# Patient Record
Sex: Male | Born: 1960 | Race: Black or African American | Hispanic: No | Marital: Single | Smoking: Current every day smoker
Health system: Southern US, Community
[De-identification: ages and names within clinical notes are randomized; demographics above are authoritative.]

## PROBLEM LIST (undated history)

## (undated) DIAGNOSIS — Z720 Tobacco use: Secondary | ICD-10-CM

## (undated) DIAGNOSIS — L0390 Cellulitis, unspecified: Secondary | ICD-10-CM

## (undated) DIAGNOSIS — E7800 Pure hypercholesterolemia, unspecified: Secondary | ICD-10-CM

## (undated) DIAGNOSIS — I10 Essential (primary) hypertension: Secondary | ICD-10-CM

## (undated) DIAGNOSIS — K859 Acute pancreatitis without necrosis or infection, unspecified: Secondary | ICD-10-CM

---

## 2013-03-26 ENCOUNTER — Emergency Department (HOSPITAL_COMMUNITY)
Admission: EM | Admit: 2013-03-26 | Discharge: 2013-03-27 | Disposition: A | Discharge status: Home / Self Care | Payer: 59 | Attending: Emergency Medicine | Admitting: Emergency Medicine

## 2013-03-26 ENCOUNTER — Emergency Department (HOSPITAL_COMMUNITY): Payer: 59

## 2013-03-26 ENCOUNTER — Encounter (HOSPITAL_COMMUNITY): Payer: Self-pay | Admitting: *Deleted

## 2013-03-26 DIAGNOSIS — R142 Eructation: Secondary | ICD-10-CM | POA: Insufficient documentation

## 2013-03-26 DIAGNOSIS — Z79899 Other long term (current) drug therapy: Secondary | ICD-10-CM | POA: Insufficient documentation

## 2013-03-26 DIAGNOSIS — F17200 Nicotine dependence, unspecified, uncomplicated: Secondary | ICD-10-CM | POA: Insufficient documentation

## 2013-03-26 DIAGNOSIS — K852 Alcohol induced acute pancreatitis without necrosis or infection: Secondary | ICD-10-CM

## 2013-03-26 DIAGNOSIS — R141 Gas pain: Secondary | ICD-10-CM | POA: Insufficient documentation

## 2013-03-26 DIAGNOSIS — R110 Nausea: Secondary | ICD-10-CM | POA: Insufficient documentation

## 2013-03-26 DIAGNOSIS — K861 Other chronic pancreatitis: Secondary | ICD-10-CM | POA: Insufficient documentation

## 2013-03-26 DIAGNOSIS — R143 Flatulence: Secondary | ICD-10-CM | POA: Insufficient documentation

## 2013-03-26 DIAGNOSIS — I10 Essential (primary) hypertension: Secondary | ICD-10-CM | POA: Insufficient documentation

## 2013-03-26 HISTORY — DX: Essential (primary) hypertension: I10

## 2013-03-26 LAB — BASIC METABOLIC PANEL
CO2: 25 mEq/L (ref 19–32)
Calcium: 9.3 mg/dL (ref 8.4–10.5)
GFR calc non Af Amer: >90 mL/min (ref >90)
Glucose, Bld: 113 mg/dL — ABNORMAL HIGH (ref 70–99)
Potassium: 3.9 mEq/L (ref 3.5–5.1)
Sodium: 134 mEq/L — ABNORMAL LOW (ref 135–145)

## 2013-03-26 LAB — CBC WITH DIFFERENTIAL/PLATELET
Basophils Absolute: 0 10*3/uL (ref 0.0–0.1)
Lymphocytes Relative: 16 % (ref 12–46)
Lymphs Abs: 1.8 10*3/uL (ref 0.7–4.0)
Neutrophils Relative %: 71 % (ref 43–77)
Platelets: 319 10*3/uL (ref 150–400)
RBC: 4.54 MIL/uL (ref 4.22–5.81)
RDW: 14.2 % (ref 11.5–15.5)
WBC: 11.5 10*3/uL — ABNORMAL HIGH (ref 4.0–10.5)

## 2013-03-26 LAB — LIPASE, BLOOD: Lipase: 131 U/L — ABNORMAL HIGH (ref 11–59)

## 2013-03-26 LAB — HEPATIC FUNCTION PANEL
Albumin: 4 g/dL (ref 3.5–5.2)
Alkaline Phosphatase: 37 U/L — ABNORMAL LOW (ref 39–117)
Indirect Bilirubin: 0.7 mg/dL (ref 0.3–0.9)
Total Protein: 7.6 g/dL (ref 6.0–8.3)

## 2013-03-26 MED ORDER — ONDANSETRON HCL 4 MG/2ML IJ SOLN
4.0000 mg | Freq: Once | INTRAMUSCULAR | Status: AC
  Administered 2013-03-26: 4 mg via INTRAVENOUS
  Filled 2013-03-26: qty 2

## 2013-03-26 MED ORDER — SODIUM CHLORIDE 0.9 % IV SOLN
INTRAVENOUS | Status: DC
  Administered 2013-03-26: 22:00:00 via INTRAVENOUS

## 2013-03-26 MED ORDER — HYDROMORPHONE HCL PF 1 MG/ML IJ SOLN
1.0000 mg | Freq: Once | INTRAMUSCULAR | Status: AC
  Administered 2013-03-26: 1 mg via INTRAVENOUS
  Filled 2013-03-26: qty 1

## 2013-03-26 NOTE — ED Notes (Signed)
Pt ambulated to the restroom to attempt to get a urine sample.

## 2013-03-26 NOTE — ED Provider Notes (Signed)
History     CSN: [id number]  Arrival date & time 03/26/13  1701   First MD Initiated Contact with Patient 03/26/13 2111      Chief Complaint  Patient presents with  . Abdominal Pain    (Consider location/radiation/quality/duration/timing/severity/associated sxs/prior treatment) HPI Spencer Doyle is a 52 y.o. male with a history of hypertension presents emergency department complaining of abdominal pain.  Onset of symptoms began yesterday and are located primarily in the epigastric region.  Pain is rated at 10/10 in severity and has been gradually worsening since onset.  Patient reports similar pain in the past from a hernia of which was never repaired.  Associated symptoms include nausea and patient reports that his stomach is larger than it usually is.  This abdominal distention has been gradual over the last month. the patient denies any vomiting, diarrhea, fevers, night sweats or chills, chest pain, leg swelling, shortness of breath or dyspnea on exertion..  Pain radiates to back.  No other complaints this time.  Past Medical History  Diagnosis Date  . Hypertension     History reviewed. No pertinent past surgical history.  No family history on file.  History  Substance Use Topics  . Smoking status: Current Every Day Smoker  . Smokeless tobacco: Never Used  . Alcohol Use: Yes      Review of Systems Ten systems reviewed and are negative for acute change, except as noted in the HPI.    Allergies  Review of patient's allergies indicates no known allergies.  Home Medications   Current Outpatient Rx  Name  Route  Sig  Dispense  Refill  . vitamin C (ASCORBIC ACID) 500 MG tablet   Oral   Take 500 mg by mouth daily.           BP 176/105  Pulse 93  Temp(Src) 98.9 F (37.2 C) (Oral)  Resp 22  SpO2 98%  Physical Exam  Nursing note and vitals reviewed. Constitutional: He is oriented to person, place, and time. He appears well-developed and well-nourished. He appears  distressed.  Hypertensive  HENT:  Head: Normocephalic and atraumatic.  Eyes: Conjunctivae and EOM are normal.  Neck: Normal range of motion.  Cardiovascular:  Regular rate rhythm.  Intact distal pulses.  Pulmonary/Chest: Effort normal.  Abdominal:  Tenderness to palpation of the right upper quadrant, epigastric and left upper quadrant regions.  No peritoneal signs.  Abdominal distention present.  No fluid wave.  Musculoskeletal: Normal range of motion.  Neurological: He is alert and oriented to person, place, and time.  Good coordination.  Normal gait.  Intact distal sensation.  Skin: Skin is warm and dry. No rash noted. He is not diaphoretic.  Psychiatric: He has a normal mood and affect. His behavior is normal.    ED Course  Procedures (including critical care time)  Labs Reviewed  CBC WITH DIFFERENTIAL - Abnormal; Notable for the following:    WBC 11.5 (*)    Neutro Abs 8.1 (*)    Monocytes Relative 13 (*)    Monocytes Absolute 1.5 (*)    All other components within normal limits  BASIC METABOLIC PANEL - Abnormal; Notable for the following:    Sodium 134 (*)    Glucose, Bld 113 (*)    All other components within normal limits  URINALYSIS, ROUTINE W REFLEX MICROSCOPIC  HEPATIC FUNCTION PANEL  LIPASE, BLOOD   US Abdomen Complete  03/26/2013  *RADIOLOGY REPORT*  Clinical Data:  Abdominal pain.  Elevated lipase and LFTs.  COMPLETE ABDOMINAL ULTRASOUND  Comparison:  None.  Findings:  Gallbladder:  No stones or wall thickening.  Negative sonographic Murphy's.  Common bile duct:   Normal caliber, 5 mm.  Liver:  2 hyperechoic areas within the liver.  The largest is in the inferior right hepatic lobe and measures up to 11 mm.  These are most compatible with hemangiomas, but follow-up is recommended. No biliary ductal dilatation.  IVC:  Appears normal.  Pancreas:  No focal abnormality seen.  Spleen:  Within normal limits in size and echotexture.  Right Kidney:   Normal in size and  parenchymal echogenicity.  No evidence of mass or hydronephrosis.  Left Kidney:  Normal in size and parenchymal echogenicity.  No evidence of mass or hydronephrosis.  Abdominal aorta:  No aneurysm identified.  IMPRESSION: 2 small hyperechoic areas within the right hepatic lobe, most compatible with hemangiomas.  Follow-up ultrasound in 6-12 months could be performed to confirm stability.  No acute findings.   Original Report Authenticated By: Charlett Nose, M.D.    Ct Abdomen Pelvis W Contrast  03/27/2013  *RADIOLOGY REPORT*  Clinical Data: 52 year old male with abdominal pain increasing times 2 days.  Abnormal lipase.  CT ABDOMEN AND PELVIS WITH CONTRAST  Technique:  Multidetector CT imaging of the abdomen and pelvis was performed following the standard protocol during bolus administration of intravenous contrast.  Contrast: OMNIPAQUE IOHEXOL 300 MG/ML  SOLN  Comparison: Abdominal ultrasound 03/26/2013.  Findings: Dependent pulmonary opacity in both lower lobes most resembles atelectasis.  No pleural or pericardial effusion.  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  Chronic disc and endplate degeneration in the lower lumbar spine. No acute osseous abnormality identified.  No pelvic free fluid.  Mildly distended but otherwise unremarkable bladder.  Inflammation surrounding the tail of the pancreas, splenic flexure, and lower pole of the spleen (series 2 image 22). Enhancement of the pancreatic tail is preserved.  The body, head and uncinate process and have more normal appearance.  The splenic vein remains patent along with the main portal vein and portal venous system. Trace layering fluid.  No organized fluid collection.  No free air.  Mild gaseous distention then of the transverse colon.  Right colon including the appendix is normal.  No dilated small bowel.  Oral contrast has not yet reached the distal small bowel.  Mild inflammation involving the proximal stomach.  Distal  stomach and duodenum have more normal appearance.  Mild decreased density throughout the liver.  Small hypoechoic areas are occasionally noted (in the posterior right hepatic lobe series 2 image 27) and fade on delayed images is compatible with benign hemangioma as suspected on the earlier ultrasound. A small area of early hyperenhancement at the dome on image 12 also appears benign.  Gallbladder, adrenal glands, and kidneys are within normal limits. Major arterial structures in the abdomen pelvis are patent although there is moderate intermittent atherosclerosis noted.  IMPRESSION: 1.  Acute pancreatitis with inflammation involving the tail, and secondarily involving the spleen and splenic flexure of colon. Small volume free fluid with no drainable or organized fluid collection.  No other complicating features identified. 2.  Hepatic steatosis with several small benign hepatic hemangioma is suspected. 3.  Atherosclerosis.   Original Report Authenticated By: Erskine Speed, M.D.      No diagnosis found.  52 year old male to emergency Department complaining of upper abdominal pain that has gradually worsened since yesterday and now severe.  From triage patient has leukocytosis  with shift to the left, is hypertensive, but afebrile with normal heart rate and respirations.  Patient appears distressed.  Pain medications ordered as well as abdominal ultrasound, liver function studies and lipase.  Suspect possible biliary etiology.  MDM  Alcoholic pancreatitis  Pt states that he has been told he has hx of alcoholic pancreatitis in past & admits to drinking 12 pack daily. Pt does not appear in distress & is w normal VS other then HTN (hx of, non compliant on medication) discuss treating physician and we'll give resource guide at discharge.  Patient is without vomiting and pain improved while in the emergency department.  Patient appears stable for discharge.  Advised PCP followup today to discuss both pancreatitis  and hypertension.  Patient will be placed on clear liquid diet and given narcotic pain medication as well as antibiotics.       Jaci Carrel, [place] 03/27/13 [phone]

## 2013-03-26 NOTE — ED Notes (Signed)
Pt states started having abdominal pain yesterday, pain became severe around midnight, pt denies n/v/d.

## 2013-03-26 NOTE — ED Notes (Signed)
Pt unable to provide sample at this time. Urine cup remains at bedside.

## 2013-03-27 ENCOUNTER — Emergency Department (HOSPITAL_COMMUNITY): Payer: 59

## 2013-03-27 ENCOUNTER — Encounter (HOSPITAL_COMMUNITY): Payer: Self-pay

## 2013-03-27 LAB — URINALYSIS, ROUTINE W REFLEX MICROSCOPIC
Glucose, UA: NEGATIVE mg/dL
Hgb urine dipstick: NEGATIVE
Leukocytes, UA: NEGATIVE
Protein, ur: NEGATIVE mg/dL
Specific Gravity, Urine: 1.019 (ref 1.005–1.030)
Urobilinogen, UA: 0.2 mg/dL (ref 0.0–1.0)

## 2013-03-27 MED ORDER — SODIUM CHLORIDE 0.9 % IV BOLUS (SEPSIS)
1000.0000 mL | Freq: Once | INTRAVENOUS | Status: AC
  Administered 2013-03-27: 1000 mL via INTRAVENOUS

## 2013-03-27 MED ORDER — HYDROCODONE-ACETAMINOPHEN 5-325 MG PO TABS: 1.0000 | ORAL_TABLET | Freq: Four times a day (QID) | ORAL | Status: DC | PRN

## 2013-03-27 MED ORDER — ONDANSETRON 4 MG PO TBDP: 4.0000 mg | ORAL_TABLET | Freq: Four times a day (QID) | ORAL | Status: DC | PRN

## 2013-03-27 MED ORDER — IOHEXOL 300 MG/ML  SOLN
50.0000 mL | Freq: Once | INTRAMUSCULAR | Status: AC | PRN
  Administered 2013-03-27: 50 mL via ORAL

## 2013-03-27 MED ORDER — IOHEXOL 300 MG/ML  SOLN
100.0000 mL | Freq: Once | INTRAMUSCULAR | Status: AC | PRN
  Administered 2013-03-27: 100 mL via INTRAVENOUS

## 2013-03-27 NOTE — ED Provider Notes (Signed)
Medical screening examination/treatment/procedure(s) were conducted as a shared visit with non-physician practitioner(s) and myself.  I personally evaluated the patient during the encounter  Concerning for pancreatitis. Will image  Lyanne Co, MD 03/27/13 1425

## 2013-03-27 NOTE — ED Provider Notes (Signed)
And abdominal pain onset epigastric onset 2 days ago. Patient is to drinking approximately one12 pack every weekend. He's had no vomiting he admits to nausea no fever no other complaint. Patient reports he was told he had alcoholic pancreatitis several years ago. On exam patient is alert and nontoxic abdomen soft normoactive bowel sounds tender epigastrium no guarding plan clear liquid diet analgesics avoid alcohol followup with PMD later today  Doug Sou, MD 03/27/13 0230

## 2014-04-28 ENCOUNTER — Emergency Department (HOSPITAL_COMMUNITY)
Admission: EM | Admit: 2014-04-28 | Discharge: 2014-04-29 | Disposition: A | Discharge status: Home / Self Care | Payer: 59 | Attending: Emergency Medicine | Admitting: Emergency Medicine

## 2014-04-28 ENCOUNTER — Encounter (HOSPITAL_COMMUNITY): Payer: Self-pay | Admitting: Emergency Medicine

## 2014-04-28 ENCOUNTER — Emergency Department (HOSPITAL_COMMUNITY): Payer: 59

## 2014-04-28 DIAGNOSIS — L03818 Cellulitis of other sites: Principal | ICD-10-CM

## 2014-04-28 DIAGNOSIS — L02818 Cutaneous abscess of other sites: Secondary | ICD-10-CM | POA: Insufficient documentation

## 2014-04-28 DIAGNOSIS — F17200 Nicotine dependence, unspecified, uncomplicated: Secondary | ICD-10-CM | POA: Insufficient documentation

## 2014-04-28 DIAGNOSIS — L03213 Periorbital cellulitis: Secondary | ICD-10-CM

## 2014-04-28 DIAGNOSIS — I10 Essential (primary) hypertension: Secondary | ICD-10-CM | POA: Insufficient documentation

## 2014-04-28 DIAGNOSIS — Z79899 Other long term (current) drug therapy: Secondary | ICD-10-CM | POA: Insufficient documentation

## 2014-04-28 DIAGNOSIS — H5710 Ocular pain, unspecified eye: Secondary | ICD-10-CM | POA: Insufficient documentation

## 2014-04-28 LAB — BASIC METABOLIC PANEL
BUN: 13 mg/dL (ref 6–23)
CALCIUM: 9.6 mg/dL (ref 8.4–10.5)
CO2: 22 mEq/L (ref 19–32)
Chloride: 103 mEq/L (ref 96–112)
Creatinine, Ser: 0.81 mg/dL (ref 0.50–1.35)
Glucose, Bld: 86 mg/dL (ref 70–99)
POTASSIUM: 4.2 meq/L (ref 3.7–5.3)
SODIUM: 140 meq/L (ref 137–147)

## 2014-04-28 LAB — I-STAT CHEM 8, ED
BUN: 12 mg/dL (ref 6–23)
CALCIUM ION: 1.23 mmol/L (ref 1.12–1.23)
CREATININE: 0.9 mg/dL (ref 0.50–1.35)
Chloride: 104 mEq/L (ref 96–112)
GLUCOSE: 87 mg/dL (ref 70–99)
HCT: 39 % (ref 39.0–52.0)
HEMOGLOBIN: 13.3 g/dL (ref 13.0–17.0)
Potassium: 4 mEq/L (ref 3.7–5.3)
Sodium: 142 mEq/L (ref 137–147)
TCO2: 23 mmol/L (ref 0–100)

## 2014-04-28 LAB — CBC WITH DIFFERENTIAL/PLATELET
BASOS PCT: 0 % (ref 0–1)
Basophils Absolute: 0 10*3/uL (ref 0.0–0.1)
EOS ABS: 0.1 10*3/uL (ref 0.0–0.7)
Eosinophils Relative: 1 % (ref 0–5)
HCT: 35.3 % — ABNORMAL LOW (ref 39.0–52.0)
Hemoglobin: 12.1 g/dL — ABNORMAL LOW (ref 13.0–17.0)
Lymphocytes Relative: 42 % (ref 12–46)
Lymphs Abs: 3.4 10*3/uL (ref 0.7–4.0)
MCH: 30.8 pg (ref 26.0–34.0)
MCHC: 34.3 g/dL (ref 30.0–36.0)
MCV: 89.8 fL (ref 78.0–100.0)
Monocytes Absolute: 0.8 10*3/uL (ref 0.1–1.0)
Monocytes Relative: 10 % (ref 3–12)
NEUTROS PCT: 47 % (ref 43–77)
Neutro Abs: 3.9 10*3/uL (ref 1.7–7.7)
PLATELETS: 387 10*3/uL (ref 150–400)
RBC: 3.93 MIL/uL — ABNORMAL LOW (ref 4.22–5.81)
RDW: 14.4 % (ref 11.5–15.5)
WBC: 8.1 10*3/uL (ref 4.0–10.5)

## 2014-04-28 MED ORDER — IOHEXOL 300 MG/ML  SOLN
80.0000 mL | Freq: Once | INTRAMUSCULAR | Status: AC | PRN
  Administered 2014-04-28: 80 mL via INTRAVENOUS

## 2014-04-28 MED ORDER — TETRACAINE HCL 0.5 % OP SOLN
1.0000 [drp] | Freq: Once | OPHTHALMIC | Status: AC
  Administered 2014-04-28: 1 [drp] via OPHTHALMIC
  Filled 2014-04-28: qty 2

## 2014-04-28 MED ORDER — OXYCODONE-ACETAMINOPHEN 5-325 MG PO TABS
2.0000 | ORAL_TABLET | Freq: Once | ORAL | Status: AC
  Administered 2014-04-28: 2 via ORAL
  Filled 2014-04-28: qty 2

## 2014-04-28 MED ORDER — ONDANSETRON 4 MG PO TBDP
4.0000 mg | ORAL_TABLET | Freq: Once | ORAL | Status: AC
  Administered 2014-04-28: 4 mg via ORAL
  Filled 2014-04-28: qty 1

## 2014-04-28 MED ORDER — OXYCODONE-ACETAMINOPHEN 5-325 MG PO TABS: 1.0000 | ORAL_TABLET | ORAL | Status: DC | PRN

## 2014-04-28 MED ORDER — SULFAMETHOXAZOLE-TMP DS 800-160 MG PO TABS: 1.0000 | ORAL_TABLET | Freq: Two times a day (BID) | ORAL | Status: DC

## 2014-04-28 MED ORDER — AMOXICILLIN 875 MG PO TABS: 875.0000 mg | ORAL_TABLET | Freq: Two times a day (BID) | ORAL | Status: DC

## 2014-04-28 MED ORDER — FLUORESCEIN SODIUM 1 MG OP STRP
1.0000 | ORAL_STRIP | Freq: Once | OPHTHALMIC | Status: AC
  Administered 2014-04-28: 1 via OPHTHALMIC
  Filled 2014-04-28: qty 1

## 2014-04-28 NOTE — ED Provider Notes (Signed)
CSN: [id number]     Arrival date & time 04/28/14  1909 History  This chart was scribed for non-physician Arthor Captain, PA-C practitioner working with Enid Skeens, MD by Elveria Rising, ED Scribe. This patient was seen in room TR04C/TR04C and the patient's care was started at 8:57 PM.   Chief Complaint  Patient presents with  . Facial Swelling  . Eye Pain     The history is provided by the patient. No language interpreter was used.   HPI Comments: Spencer Doyle is a 53 y.o. male who presents to the Emergency Department complaining of right eye pain and swelling ongoing for two days. Patient reports that the pain radiates from the eyeball to the socket and is exacerbated with movement of the eye. Patient is unsure of presence of foreign body. Patient reports blurred vision, watery drainage and sensitivity to sunlight. Patient denies history of Diabetes.    Past Medical History  Diagnosis Date  . Hypertension    History reviewed. No pertinent past surgical history. No family history on file. History  Substance Use Topics  . Smoking status: Current Every Day Smoker  . Smokeless tobacco: Never Used  . Alcohol Use: Yes    Review of Systems  Constitutional: Negative for fever.  HENT: Positive for facial swelling.   Eyes: Positive for photophobia, pain, discharge and visual disturbance. Negative for itching.  Psychiatric/Behavioral: Negative for confusion.      Allergies  Review of patient's allergies indicates no known allergies.  Home Medications   Prior to Admission medications   Medication Sig Start Date End Date Taking? Authorizing Provider  hydrochlorothiazide (HYDRODIURIL) 25 MG tablet Take 25 mg by mouth 2 (two) times daily.   Yes Historical Provider, MD  vitamin C (ASCORBIC ACID) 500 MG tablet Take 500 mg by mouth daily.   Yes Historical Provider, MD  amoxicillin (AMOXIL) 875 MG tablet Take 1 tablet (875 mg total) by mouth 2 (two) times daily. 04/28/14   Arthor Captain, PA-C  oxyCODONE-acetaminophen (PERCOCET) 5-325 MG per tablet Take 1-2 tablets by mouth every 4 (four) hours as needed. 04/28/14   Arthor Captain, PA-C  sulfamethoxazole-trimethoprim (BACTRIM DS) 800-160 MG per tablet Take 1 tablet by mouth 2 (two) times daily. 04/28/14   Arthor Captain, PA-C   Triage Vitals: BP 167/109  Pulse 88  Temp(Src) 98.6 F (37 C) (Oral)  Resp 18  Wt 192 lb 3 oz (87.176 kg)  SpO2 94% Physical Exam  Nursing note and vitals reviewed. Constitutional: He is oriented to person, place, and time. He appears well-developed and well-nourished. No distress.  HENT:  Head: Normocephalic and atraumatic.  Eyes:  Slit lamp exam:      The right eye shows no fluorescein uptake.  Left eye: [employer]. Swelling. Lid is tender to palpation. Eye pressure OS: 22  OD: 16  Neck: Neck supple. No tracheal deviation present.  Cardiovascular: Normal rate.   Pulmonary/Chest: Effort normal. No respiratory distress.  Musculoskeletal: Normal range of motion.  Neurological: He is alert and oriented to person, place, and time.  Skin: Skin is warm and dry.  Psychiatric: He has a normal mood and affect. His behavior is normal.    ED Course  Procedures (including critical care time)   DIAGNOSTIC STUDIES: Oxygen Saturation is 94% on room air, adequate by my interpretation.    COORDINATION OF CARE: 9:10 PM- Discussed treatment plan with patient at bedside and patient agreed to plan.     Labs Review Labs Reviewed  CBC WITH DIFFERENTIAL - Abnormal; Notable for the following:    RBC 3.93 (*)    Hemoglobin 12.1 (*)    HCT 35.3 (*)    All other components within normal limits  BASIC METABOLIC PANEL  I-STAT CHEM 8, ED    Imaging Review Ct Orbits Wo/w Cm  04/28/2014   CLINICAL DATA:  Right eye pain and swelling.  Four prior episodes.  EXAM: CT ORBITS WITHOUT AND WITH CONTRAST  TECHNIQUE: Multidetector CT imaging of the orbits was performed using the standard protocol with  and without intravenous contrast.  CONTRAST:  80mL OMNIPAQUE IOHEXOL 300 MG/ML  SOLN  COMPARISON:  None.  FINDINGS: Minimal right periorbital soft tissue swelling, without subcutaneous gas or radiopaque foreign bodies. No subcutaneous free fluid or fluid collections.  Ocular globes are unremarkable, lenses are located. Preservation orbital fat. Extraocular muscles are located with normal morphology and symmetric appearance. Normal appearance of the optic nerve sheath complexes. Symmetric appearance of the cavernous sinuses, trace calcific atherosclerosis of the carotid siphons. Superior ophthalmic veins are not enlarged.  No destructive bony lesions. No acute facial fracture. Right anterior ethmoid sub cm osteoma, visualized paranasal sinuses are well aerated. Included mastoid air cells are well aerated. Left frontal air cell.  IMPRESSION: Minimal right periorbital soft tissue swelling could reflect cellulitis, without fluid collection or postseptal involvement.   Electronically Signed   By: Awilda Metroourtnay  Bloomer   On: 04/28/2014 23:17     EKG Interpretation None      MDM   Final diagnoses:  Preseptal cellulitis of right eye   I personally reviewed the imaging tests through PACS system. I have reviewed and interpreted Lab values. I reviewed available ER/hospitalization records through the EMR    The patient's CT scan negative for any cellulitis in the orbits. The patient does appear to have preseptal cellulitis. Discharged with antibiotics, followup with ophthalmology. Return precautions discussed.Patient / Family / Caregiver informed of clinical course, understand medical decision-making process, and agree with plan.    I personally performed the services described in this documentation, which was scribed in my presence. The recorded information has been reviewed and is accurate.    Arthor CaptainAbigail Sherrell Farish, PA-C 05/03/14 1818

## 2014-04-28 NOTE — Discharge Instructions (Signed)
Periorbital Cellulitis Periorbital cellulitis is a common infection that can affect the eyelid and the soft tissues that surround the eyeball. The infection may also affect the structures that produce and drain tears. It does not affect the eyeball itself. Natural tissue barriers usually prevent the spread of this infection to the eyeball and other deeper areas of the eye socket.  CAUSES  Bacterial infection.  Long-term (chronic) sinus infections.  An object (foreign body) stuck behind the eye.  An injury that goes through the eyelid tissues.  An injury that causes an infection, such as an insect sting.  Fracture of the bone around the eye.  Infections which have spread from the eyelid or other structures around the eye.  Bite wounds.  Inflammation or infection of the lining membranes of the brain (meningitis).  An infection in the blood (septicemia).  Dental infection (abscess).  Viral infection (this is rare). SYMPTOMS Symptoms usually come on suddenly.  Pain in the eye.  Red, hot, and swollen eyelids and possibly cheeks. The swelling is sometimes bad enough that the eyelids cannot open. Some infections make the eyelids look purple.  Fever and feeling generally ill.  Pain when touching the area around the eye. DIAGNOSIS  Periorbital cellulitis can be diagnosed from an eye exam. In severe cases, your caregiver might suggest:  Blood tests.  Imaging tests (such as a CT scan) to examine the sinuses and the area around and behind the eyeball. TREATMENT If your caregiver feels that you do not have any signs of serious infection, treatment may include:  Antibiotics.  Nasal decongestants to reduce swelling.  Referral to a dentist if it is suspected that the infection was caused by a prior tooth infection.  Examination every day to make sure the problem is improving. HOME CARE INSTRUCTIONS  Take your antibiotics as directed. Finish them even if you start to feel  better.  Some pain is normal with this condition. Take pain medicine as directed by your caregiver. Only take pain medicines approved by your caregiver.  It is important to drink fluids. Drink enough water and fluids to keep your urine clear or pale yellow.  Do not smoke.  Rest and get plenty of sleep.  Mild or moderate fevers generally have no long-term effects and often do not require treatment.  If your caregiver has given you a follow-up appointment, it is very important to keep that appointment. Your caregiver will need to make sure that the infection is getting better. It is important to check that a more serious infection is not developing. SEEK IMMEDIATE MEDICAL CARE IF:  Your eyelids become more painful, red, warm, or swollen.  You develop double vision or your vision becomes blurred or worsens in any way.  You have trouble moving your eyes.  The eye looks like it is popping out (proptosis).  You develop a severe headache, severe neck pain, or neck stiffness.  You develop repeated vomiting.  You have a fever or persistent symptoms for more than 72 hours.  You have a fever and your symptoms suddenly get worse. MAKE SURE YOU:  Understand these instructions.  Will watch your condition.  Will get help right away if you are not doing well or get worse. Document Released: 12/12/2010 Document Revised: 02/01/2012 Document Reviewed: 12/12/2010 [facility] Patient Information 2014 [place], [place]. Acetaminophen; Oxycodone tablets What is this medicine? ACETAMINOPHEN; OXYCODONE (a set a MEE noe fen; ox i KOE done) is a pain reliever. It is used to treat mild to moderate  pain. This medicine may be used for other purposes; ask your health care provider or pharmacist if you have questions. COMMON BRAND NAME(S): Endocet, Magnacet, Narvox, Percocet, Perloxx, Primalev, Primlev, Roxicet, Xolox What should I tell my health care provider before I take this medicine? They need to know  if you have any of these conditions: -brain tumor -Crohn's disease, inflammatory bowel disease, or ulcerative colitis -drug abuse or addiction -head injury -heart or circulation problems -if you often drink alcohol -kidney disease or problems going to the bathroom -liver disease -lung disease, asthma, or breathing problems -an unusual or allergic reaction to acetaminophen, oxycodone, other opioid analgesics, other medicines, foods, dyes, or preservatives -pregnant or trying to get pregnant -breast-feeding How should I use this medicine? Take this medicine by mouth with a full glass of water. Follow the directions on the prescription label. Take your medicine at regular intervals. Do not take your medicine more often than directed. Talk to your pediatrician regarding the use of this medicine in children. Special care may be needed. Patients over 53 years old may have a stronger reaction and need a smaller dose. Overdosage: If you think you have taken too much of this medicine contact a poison control center or emergency room at once. NOTE: This medicine is only for you. Do not share this medicine with others. What if I miss a dose? If you miss a dose, take it as soon as you can. If it is almost time for your next dose, take only that dose. Do not take double or extra doses. What may interact with this medicine? -alcohol -antihistamines -barbiturates like amobarbital, butalbital, butabarbital, methohexital, pentobarbital, phenobarbital, thiopental, and secobarbital -benztropine -drugs for bladder problems like solifenacin, trospium, oxybutynin, tolterodine, hyoscyamine, and methscopolamine -drugs for breathing problems like ipratropium and tiotropium -drugs for certain stomach or intestine problems like propantheline, homatropine methylbromide, glycopyrrolate, atropine, belladonna, and dicyclomine -general anesthetics like etomidate, ketamine, nitrous oxide, propofol, desflurane,  enflurane, halothane, isoflurane, and sevoflurane -medicines for depression, anxiety, or psychotic disturbances -medicines for sleep -muscle relaxants -naltrexone -narcotic medicines (opiates) for pain -phenothiazines like perphenazine, thioridazine, chlorpromazine, mesoridazine, fluphenazine, prochlorperazine, promazine, and trifluoperazine -scopolamine -tramadol -trihexyphenidyl This list may not describe all possible interactions. Give your health care provider a list of all the medicines, herbs, non-prescription drugs, or dietary supplements you use. Also tell them if you smoke, drink alcohol, or use illegal drugs. Some items may interact with your medicine. What should I watch for while using this medicine? Tell your doctor or health care professional if your pain does not go away, if it gets worse, or if you have new or a different type of pain. You may develop tolerance to the medicine. Tolerance means that you will need a higher dose of the medication for pain relief. Tolerance is normal and is expected if you take this medicine for a long time. Do not suddenly stop taking your medicine because you may develop a severe reaction. Your body becomes used to the medicine. This does NOT mean you are addicted. Addiction is a behavior related to getting and using a drug for a non-medical reason. If you have pain, you have a medical reason to take pain medicine. Your doctor will tell you how much medicine to take. If your doctor wants you to stop the medicine, the dose will be slowly lowered over time to avoid any side effects. You may get drowsy or dizzy. Do not drive, use machinery, or do anything that needs mental alertness until you know  how this medicine affects you. Do not stand or sit up quickly, especially if you are an older patient. This reduces the risk of dizzy or fainting spells. Alcohol may interfere with the effect of this medicine. Avoid alcoholic drinks. There are different types of  narcotic medicines (opiates) for pain. If you take more than one type at the same time, you may have more side effects. Give your health care provider a list of all medicines you use. Your doctor will tell you how much medicine to take. Do not take more medicine than directed. Call emergency for help if you have problems breathing. The medicine will cause constipation. Try to have a bowel movement at least every 2 to 3 days. If you do not have a bowel movement for 3 days, call your doctor or health care professional. Do not take Tylenol (acetaminophen) or medicines that have acetaminophen with this medicine. Too much acetaminophen can be very dangerous. Many nonprescription medicines contain acetaminophen. Always read the labels carefully to avoid taking more acetaminophen. What side effects may I notice from receiving this medicine? Side effects that you should report to your doctor or health care professional as soon as possible: -allergic reactions like skin rash, itching or hives, swelling of the face, lips, or tongue -breathing difficulties, wheezing -confusion -light headedness or fainting spells -severe stomach pain -unusually weak or tired -yellowing of the skin or the whites of the eyes  Side effects that usually do not require medical attention (report to your doctor or health care professional if they continue or are bothersome): -dizziness -drowsiness -nausea -vomiting This list may not describe all possible side effects. Call your doctor for medical advice about side effects. You may report side effects to FDA at 1-800-FDA-1088. Where should I keep my medicine? Keep out of the reach of children. This medicine can be abused. Keep your medicine in a safe place to protect it from theft. Do not share this medicine with anyone. Selling or giving away this medicine is dangerous and against the law. Store at room temperature between 20 and 25 degrees C (68 and 77 degrees F). Keep container  tightly closed. Protect from light. This medicine may cause accidental overdose and death if it is taken by other adults, children, or pets. Flush any unused medicine down the toilet to reduce the chance of harm. Do not use the medicine after the expiration date. NOTE: This sheet is a summary. It may not cover all possible information. If you have questions about this medicine, talk to your doctor, pharmacist, or health care provider.  2014, [employer]. (2013-07-03 13:17:35)

## 2014-04-28 NOTE — ED Notes (Addendum)
C/o R eye swelling, also pain, onset Wednesday morning. "feels like L eye might be starting", no known injury, h/o similar 4x in the past. Has tried dry eye gtts. Td >10 yrs.

## 2014-05-04 NOTE — ED Provider Notes (Signed)
Medical screening examination/treatment/procedure(s) were performed by non-physician practitioner and as supervising physician I was immediately available for consultation/collaboration.   EKG Interpretation None       Toy BakerAnthony T Cayton Cuevas, MD 05/04/14 [phone]

## 2014-10-24 ENCOUNTER — Emergency Department (HOSPITAL_COMMUNITY)
Admission: EM | Admit: 2014-10-24 | Discharge: 2014-10-24 | Disposition: A | Discharge status: Home / Self Care | Payer: 59 | Attending: Emergency Medicine | Admitting: Emergency Medicine

## 2014-10-24 ENCOUNTER — Encounter (HOSPITAL_COMMUNITY): Payer: Self-pay | Admitting: Emergency Medicine

## 2014-10-24 DIAGNOSIS — Z720 Tobacco use: Secondary | ICD-10-CM | POA: Diagnosis not present

## 2014-10-24 DIAGNOSIS — H9202 Otalgia, left ear: Secondary | ICD-10-CM | POA: Diagnosis present

## 2014-10-24 DIAGNOSIS — H6012 Cellulitis of left external ear: Secondary | ICD-10-CM | POA: Insufficient documentation

## 2014-10-24 DIAGNOSIS — Z630 Problems in relationship with spouse or partner: Secondary | ICD-10-CM | POA: Insufficient documentation

## 2014-10-24 DIAGNOSIS — I10 Essential (primary) hypertension: Secondary | ICD-10-CM | POA: Diagnosis not present

## 2014-10-24 DIAGNOSIS — Z792 Long term (current) use of antibiotics: Secondary | ICD-10-CM | POA: Insufficient documentation

## 2014-10-24 DIAGNOSIS — Z8639 Personal history of other endocrine, nutritional and metabolic disease: Secondary | ICD-10-CM | POA: Insufficient documentation

## 2014-10-24 HISTORY — DX: Pure hypercholesterolemia, unspecified: E78.00

## 2014-10-24 MED ORDER — SULFAMETHOXAZOLE-TRIMETHOPRIM 800-160 MG PO TABS: 1.0000 | ORAL_TABLET | Freq: Two times a day (BID) | ORAL | Status: DC

## 2014-10-24 MED ORDER — TRAMADOL HCL 50 MG PO TABS: 50.0000 mg | ORAL_TABLET | Freq: Four times a day (QID) | ORAL | Status: DC | PRN

## 2014-10-24 NOTE — Discharge Instructions (Signed)
Cellulitis °Cellulitis is an infection of the skin and the tissue beneath it. The infected area is usually red and tender. Cellulitis occurs most often in the arms and lower legs.  °CAUSES  °Cellulitis is caused by bacteria that enter the skin through cracks or cuts in the skin. The most common types of bacteria that cause cellulitis are staphylococci and streptococci. °SIGNS AND SYMPTOMS  °· Redness and warmth. °· Swelling. °· Tenderness or pain. °· Fever. °DIAGNOSIS  °Your health care provider can usually determine what is wrong based on a physical exam. Blood tests may also be done. °TREATMENT  °Treatment usually involves taking an antibiotic medicine. °HOME CARE INSTRUCTIONS  °· Take your antibiotic medicine as directed by your health care provider. Finish the antibiotic even if you start to feel better. °· Keep the infected arm or leg elevated to reduce swelling. °· Apply a warm cloth to the affected area up to 4 times per day to relieve pain. °· Take medicines only as directed by your health care provider. °· Keep all follow-up visits as directed by your health care provider. °SEEK MEDICAL CARE IF:  °· You notice red streaks coming from the infected area. °· Your red area gets larger or turns dark in color. °· Your bone or joint underneath the infected area becomes painful after the skin has healed. °· Your infection returns in the same area or another area. °· You notice a swollen bump in the infected area. °· You develop new symptoms. °· You have a fever. °SEEK IMMEDIATE MEDICAL CARE IF:  °· You feel very sleepy. °· You develop vomiting or diarrhea. °· You have a general ill feeling (malaise) with muscle aches and pains. °MAKE SURE YOU:  °· Understand these instructions. °· Will watch your condition. °· Will get help right away if you are not doing well or get worse. °Document Released: 08/19/2005 Document Revised: 03/26/2014 Document Reviewed: 01/25/2012 °ExitCare® Patient Information ©2015 [employer].  This information is not intended to replace advice given to you by your health care provider. Make sure you discuss any questions you have with your health care provider. ° ° °Emergency Department Resource Guide °1) Find a Doctor and Pay Out of Pocket °Although you won't have to find out who is covered by your insurance plan, it is a good idea to ask around and get recommendations. You will then need to call the office and see if the doctor you have chosen will accept you as a new patient and what types of options they offer for patients who are self-pay. Some doctors offer discounts or will set up payment plans for their patients who do not have insurance, but you will need to ask so you aren't surprised when you get to your appointment. ° °2) Contact Your Local Health Department °Not all health departments have doctors that can see patients for sick visits, but many do, so it is worth a call to see if yours does. If you don't know where your local health department is, you can check in your phone book. The CDC also has a tool to help you locate your state's health department, and many state websites also have listings of all of their local health departments. ° °3) Find a Walk-in Clinic °If your illness is not likely to be very severe or complicated, you may want to try a walk in clinic. These are popping up all over the country in pharmacies, drugstores, and shopping centers. They're usually staffed by nurse practitioners   or physician assistants that have been trained to treat common illnesses and complaints. They're usually fairly quick and inexpensive. However, if you have serious medical issues or chronic medical problems, these are probably not your best option. ° °No Primary Care Doctor: °- Call [facility] at  832-8000 - they can help you locate a primary care doctor that  accepts your insurance, provides certain services, etc. °- Physician Referral Service- 1-800-533-3463 ° °Chronic Pain  Problems: °Organization         Address  Phone   Notes  °[facility]  (336) 297-2271 Patients need to be referred by their primary care doctor.  ° °Medication Assistance: °Organization         Address  Phone   Notes  °[place] [facility] [address] °[place], [place] [address] (336) 641-8030 --Must be a resident of [place] °-- Must have NO insurance coverage whatsoever (no Medicaid/ Medicare, etc.) °-- The pt. MUST have a primary care doctor that directs their care regularly and follows them in the community °  °[facility]  (866) 331-1348   °[employer]  (888) 892-1162   ° °Agencies that provide inexpensive medical care: °Organization         Address  Phone   Notes  °[facility]  (336) 832-8035   °[facility]    (336) 832-7272   °[facility] [address] °[place], [place] [address] (336) 832-4777   °[facility] [address], °[place] (336) 271-4999   °[facility]    (336) 373-0678   °[facility]    (336) 272-1050   °[facility] ° [address], [place] Phone:  (336) 832-4444, Fax:  (336) 832-4440 Hours of Operation:  9 am - 6 pm, M-F.  Also accepts Medicaid/Medicare and self-pay.  °[facility] ° [address], [place] Phone: (336) 832-3150, Fax: (336) 832-3151. Hours of Operation:  8:30 am - 5:30 pm, M-F.  Also accepts Medicaid and self-pay.  °[facility] [place] [address], [place] Phone: (336) 878-6027   °[facility] [address], [place], [place] (336)723-1848, Ext. 123 Mondays & Thursdays: 7-9 AM.  First 15 patients are seen on a first come, first serve basis. °  ° °Medicaid-accepting [place] Providers: ° °Organization         Address  Phone   Notes  °[facility] [address], [place] (336) 641-2100 Also  accepts self-pay patients.  °[facility] [address], [place] ° (336) 856-9996   °[facility] [address], [place] (336) 288-8857   °[facility] [address] [place] [address], [place] (336) 299-7000   °Veita Bland [address], [place]  ° (336) 373-1557 Only accepts [place] Access Medicaid patients after they have their name applied to their card.  ° °Self-Pay (no insurance) in [place]: ° °Organization         Address  Phone   Notes  °Sickle Cell Patients, [facility] [address], [place] (336) 832-1970   °[facility] [address], [place] (336) 832-4400   °[facility] ° [address], [place] (336) 992-4800   °[facility]/Dr. Osei-Bonsu ° [address]   [place] [address], [place] or [address], [place] (336) 841-8500 Phone number for both [place] and [place] locations is the same.  °[facility] [address], [place] (336) 299-0000   °[facility] [address] [place] [address], [place] or [address] (336) 852-7530 °(336) 878-2260   °[facility] [address], [place] (336) 350-1642, phone; (336) 294-5005, fax Sees patients 1st and 3rd Saturday of every month.  Must not qualify for public or private insurance (i.e. Medicaid, Medicare, [place] Health Choice, Veterans' Benefits) • Household income should be no more than 200% of the poverty level •The clinic cannot treat you if you are pregnant or think you are pregnant • Sexually transmitted diseases are not treated at the clinic.  ° ° °Dental Care: °Organization         Address  Phone  Notes  °[facility] [facility] [address], [place] (336) 641-6152 Accepts children up to age 21 who are enrolled in Medicaid or [place] Health Choice; pregnant  women with a Medicaid card; and children who have applied for Medicaid or [place] Health Choice, but were declined, whose parents can pay a reduced fee at time of service.  °[facility] [place]  [address], [place] (336) 641-7733 Accepts children up to age 21 who are enrolled in Medicaid or [place] Health Choice; pregnant women with a Medicaid card; and children who have applied for Medicaid or [place] Health Choice, but were declined, whose parents can pay a reduced fee at time of service.  °[facility] ° [address], [place] (336) 641-4533 Patients are seen by appointment only. Walk-ins are not accepted. [facility] will see patients 18 years of age and older. °Monday - Tuesday (8am-5pm) °Most Wednesdays (8:30-5pm) °$30 per visit, cash only  °[facility] ° [address], [place] (336) 641-4533 Patients are seen by appointment only. Walk-ins are not accepted. [facility] will see patients 18 years of age and older. °One Wednesday Evening (Monthly: Volunteer Based).  $30 per visit, cash only  °[facility]  (919) 537-3737 for adults; Children under age 4, call Graduate Pediatric Dentistry at (919) [phone]. Children aged 4-14, please call (919) 537-3737 to request a pediatric application. ° Dental services are provided in all areas of dental care including fillings, crowns and bridges, complete and partial dentures, implants, gum treatment, root canals, and extractions. Preventive care is also provided. Treatment is provided to both adults and children. °Patients are selected via a lottery and there is often a waiting list. °  °[facility] [address], °[place] ° (336) 763-8833 [email or web address] °  °[facility] [address], [place], [place] (336)723-1848, Ext. 123 Second and Fourth Thursday of each month, opens at 6:30 AM; Clinic ends at 9 AM.  Patients are  seen on a first-come first-served basis, and a limited number are seen during each clinic.  ° °[facility] ° [address], [place], [place] (336) 723-7904   Eligibility Requirements °You must have lived in [place], [place], or [place] counties for at least the last three months. °  You cannot be eligible for state or federal sponsored healthcare insurance, including Veterans Administration, Medicaid, or Medicare. °  You generally cannot be eligible for healthcare insurance through your employer.  °    How to apply: °Eligibility screenings are held every Tuesday and Wednesday afternoon from 1:00 pm until 4:00 pm. You do not need an appointment for the interview!  °[facility] [address], [place], [place] 336-631-2330   °[facility]  336-342-8273   °[facility]  336-703-3100   °[facility]  336-570-6415   ° °Behavioral Health Resources in the Community: °Intensive Outpatient Programs °Organization         Address  Phone  Notes  °[facility] [address], [place], [place] 336-878-6098   °[facility] [address], [place], [place] 336-832-9800   °[facility] [address], [place], [place] ° 336-882-2125   °[facility] [address],  °[place], [place] 1-800-853-5163 or 336-641-4981   °Substance Abuse Resources °Organization         Address  Phone  Notes  °[facility]  336-882-2125   °[facility]  336-784-9470   °[facility]  336-285-9073   °[facility]  336-845-3988   °Residential & Outpatient Substance Abuse Program  1-800-659-3381   °Psychological Services °Organization         Address  Phone  Notes  ° Health  336- 832-9600   °[facility]  336- 378-7881   °[facility] [address], [place] 1-800-853-5163 or 336-641-4981   ° °Mobile Crisis  Teams °Organization         Address  Phone  Notes  °[facility], Mobile Crisis Care Unit  1-877-626-1772   °Assertive °[facility] ° [address] [place], [place] 336-834-9664   °Sharon DeEsch [address] °[place] 336-554-5454   ° °Self-Help/Support Groups °Organization         Address  Phone             Notes  °[facility] - variety of support groups  336- 373-1402 Call for more information  °Narcotics Anonymous (NA), [facility] [address], °[place]  2 meetings at this location  ° °Residential Treatment Programs °Organization         Address  Phone  Notes  °[facility] [address],    °[place]  1-866-801-8205   °[facility] ° [address], [place], [place] 704-293-8524   °[facility] [address], [place] 336-845-3988 Admissions: 8am-3pm M-F  °[facility] [address]    °[place], [place] 336-841-1104   °[facility] [address], [place], [place] 336-379-7146   °[facility] [address]  °[place], [place] 336-285-9073   °[facility] - Intensive Outpatient [address], [place], [place] 336-852-3033   °[facility] [address]  °[place], [place] 1-877-615-2722 or 336-784-9470   °[facility] [address] [place], [place] 336-227-7417 Accepts Medicaid  °[facility] [address]  °[place] 1-800-659-3381 Substance Abuse/Addiction Treatment  ° °[place] Behavioral Health Resources °Organization         Address  Phone  Notes  °[facility]  (888) 581-9988   °Julie Brannon, PhD [address] [place], [place]   (336) 349-5553 or (336) 951-0000   °[facility]   [address] °[place],  (336) 349-4454   °[facility] [address],   [place], [place] (336) 342-8316  Insurance/Medicaid/sponsorship through [facility]  °[facility] [address]                                    [place], [place] (336) 342-8316 Therapy/tele-psych/case  °[facility] [address]  ° [place], [place] (336) 349-2233    °Dr. Arfeen  (336) 349-4544   °[facility] [place]  [employer] [place] [facility] 1) [address], [place] °2) [address], [place] °3)  [address], [place] (336) 349-3220 °(336) 342-7768 ° °(336) 342-8140   °[place] [facility] (336) 342-1394 or (336) 342-3537 (After Hours)    ° ° ° °

## 2014-10-24 NOTE — ED Provider Notes (Signed)
CSN: [id number]     Arrival date & time 10/24/14  1311 History  This chart was scribed for non-physician practitioner, Eben Burowourtney A Forcucci, PA-C, working with Tilden FossaElizabeth Rees, MD by Charline BillsEssence Howell, ED Scribe. This patient was seen in room TR06C/TR06C and the patient's care was started at 2:28 PM.   Chief Complaint  Patient presents with  . Otalgia  . Facial Swelling   The history is provided by the patient. No language interpreter was used.   HPI Comments: Spencer Doyle is a [age] y.o. male, with a h/o HTN, high cholesterol, who presents to the Emergency Department complaining of constant L ear pain onset 1 week ago. Pt states that he felt like something was in his ear that he tried to remove prior to onset of pain. Pt currently rates his pain 8-9/10. He reports associated loss of appetite, white ear discharge, warmth and redness. Pt denies fever, chills, nausea, vomiting. Pt denies recent boxing or swimming. Pt has been treating with Marlin CanaryGoody Powder. No h/o similar symptoms. No known allergies.   Past Medical History  Diagnosis Date  . Hypertension   . High cholesterol    History reviewed. No pertinent past surgical history. No family history on file. History  Substance Use Topics  . Smoking status: Current Every Day Smoker -- 0.50 packs/day  . Smokeless tobacco: Never Used  . Alcohol Use: Yes     Comment: occasionally    Review of Systems  Constitutional: Positive for appetite change. Negative for fever and chills.  HENT: Positive for ear pain and facial swelling.   Gastrointestinal: Negative for nausea and vomiting.  All other systems reviewed and are negative.  Allergies  Review of patient's allergies indicates no known allergies.  Home Medications   Prior to Admission medications   Medication Sig Start Date End Date Taking? Authorizing Provider  amoxicillin (AMOXIL) 875 MG tablet Take 1 tablet (875 mg total) by mouth 2 (two) times daily. 04/28/14   Arthor CaptainAbigail Harris, PA-C   hydrochlorothiazide (HYDRODIURIL) 25 MG tablet Take 25 mg by mouth 2 (two) times daily.    Historical Provider, MD  oxyCODONE-acetaminophen (PERCOCET) 5-325 MG per tablet Take 1-2 tablets by mouth every 4 (four) hours as needed. 04/28/14   Arthor CaptainAbigail Harris, PA-C  sulfamethoxazole-trimethoprim (SEPTRA DS) 800-160 MG per tablet Take 1 tablet by mouth every 12 (twelve) hours. 10/24/14   Keller Bounds A Forcucci, PA-C  traMADol (ULTRAM) 50 MG tablet Take 1 tablet (50 mg total) by mouth every 6 (six) hours as needed. 10/24/14   Jermel Artley A Forcucci, PA-C  vitamin C (ASCORBIC ACID) 500 MG tablet Take 500 mg by mouth daily.    Historical Provider, MD   Triage Vitals: BP 162/87 mmHg  Pulse 86  Temp(Src) 98.6 F (37 C) (Oral)  Resp 16  Ht 6\' 1"  (1.854 m)  Wt 198 lb (89.812 kg)  BMI 26.13 kg/m2  SpO2 96% Physical Exam  Constitutional: He is oriented to person, place, and time. He appears well-developed and well-nourished. No distress.  HENT:  Head: Normocephalic.  Right Ear: Tympanic membrane normal.  Left Ear: Tympanic membrane normal.  Nose: Nose normal.  Mouth/Throat: Oropharynx is clear and moist.  L ear pinna and auricle are swollen. Wound in between the upper pinna fold with drainage that is purulent and yellow. Tenderness to palpation. Warmth.   Eyes: Pupils are equal, round, and reactive to light.  Cardiovascular: Normal rate, regular rhythm and normal heart sounds.  Exam reveals no gallop and no friction rub.  No murmur heard. Pulmonary/Chest: Effort normal and breath sounds normal.  Lymphadenopathy:       Head (left side): Posterior auricular adenopathy present.  Neurological: He is alert and oriented to person, place, and time. He has normal reflexes.  Skin: Skin is warm.  Psychiatric: He has a normal mood and affect. His behavior is normal.  Nursing note and vitals reviewed.  ED Course  Procedures (including critical care time) DIAGNOSTIC STUDIES: Oxygen Saturation is 96% on RA,  adequate by my interpretation.    COORDINATION OF CARE: 2:34 PM-Discussed treatment plan which includes follow-up with ENT, warm compresses and antibiotics with pt at bedside. Also discussed return precautions and pt agreed to plan.   Labs Review Labs Reviewed - No data to display  Imaging Review No results found.   EKG Interpretation None      MDM   Final diagnoses:  Cellulitis of ear, left   53 year old male who presents emergency room for evaluation of left ear swelling and drainage. Physical exam reveals nontoxic and alert appearing male with swelling to the outer ear with some warmth, drainage, and tenderness to palpation. Suspect that this is likely cellulitis of the external ear. We will discharge the patient home with Bactrim and Ultram to treat for cellulitis. I have told the patient that if he has any worsening swelling, shortness of breath, difficulty opening his mouth, neck swelling, or any other concerning symptoms he should return to the emergency room immediately. I will refer the patient to ear nose and throat as he will need follow-up. Patient is stable for discharge at this time. He states understanding and agreement to the above plan.  I personally performed the services described in this documentation, which was scribed in my presence. The recorded information has been reviewed and is accurate.    Eben Burowourtney A Forcucci, PA-C 10/24/14 1445  Tilden FossaElizabeth Rees, MD 10/24/14 1610

## 2014-10-24 NOTE — ED Notes (Signed)
Swelling to outer left ear--draining from upper pinna area.

## 2014-10-31 ENCOUNTER — Encounter (HOSPITAL_COMMUNITY): Payer: Self-pay

## 2014-10-31 ENCOUNTER — Inpatient Hospital Stay (HOSPITAL_COMMUNITY)
Admission: EM | Admit: 2014-10-31 | Discharge: 2014-11-02 | DRG: 156 | Disposition: A | Discharge status: Home / Self Care | Payer: 59 | Attending: Internal Medicine | Admitting: Internal Medicine

## 2014-10-31 DIAGNOSIS — H6010 Cellulitis of external ear, unspecified ear: Secondary | ICD-10-CM | POA: Insufficient documentation

## 2014-10-31 DIAGNOSIS — L0390 Cellulitis, unspecified: Secondary | ICD-10-CM

## 2014-10-31 DIAGNOSIS — H60502 Unspecified acute noninfective otitis externa, left ear: Secondary | ICD-10-CM | POA: Diagnosis present

## 2014-10-31 DIAGNOSIS — H938X2 Other specified disorders of left ear: Secondary | ICD-10-CM

## 2014-10-31 DIAGNOSIS — H6690 Otitis media, unspecified, unspecified ear: Secondary | ICD-10-CM | POA: Diagnosis present

## 2014-10-31 DIAGNOSIS — E785 Hyperlipidemia, unspecified: Secondary | ICD-10-CM | POA: Diagnosis present

## 2014-10-31 DIAGNOSIS — H6012 Cellulitis of left external ear: Principal | ICD-10-CM | POA: Diagnosis present

## 2014-10-31 DIAGNOSIS — B029 Zoster without complications: Secondary | ICD-10-CM | POA: Diagnosis present

## 2014-10-31 DIAGNOSIS — H938X9 Other specified disorders of ear, unspecified ear: Secondary | ICD-10-CM | POA: Insufficient documentation

## 2014-10-31 DIAGNOSIS — H9202 Otalgia, left ear: Secondary | ICD-10-CM

## 2014-10-31 DIAGNOSIS — F17210 Nicotine dependence, cigarettes, uncomplicated: Secondary | ICD-10-CM | POA: Diagnosis present

## 2014-10-31 DIAGNOSIS — E780 Pure hypercholesterolemia: Secondary | ICD-10-CM | POA: Diagnosis present

## 2014-10-31 DIAGNOSIS — H61039 Chondritis of external ear, unspecified ear: Secondary | ICD-10-CM | POA: Diagnosis present

## 2014-10-31 DIAGNOSIS — I10 Essential (primary) hypertension: Secondary | ICD-10-CM | POA: Diagnosis present

## 2014-10-31 HISTORY — DX: Tobacco use: Z72.0

## 2014-10-31 HISTORY — DX: Cellulitis, unspecified: L03.90

## 2014-10-31 HISTORY — DX: Acute pancreatitis without necrosis or infection, unspecified: K85.90

## 2014-10-31 LAB — CBC WITH DIFFERENTIAL/PLATELET
Basophils Absolute: 0 10*3/uL (ref 0.0–0.1)
Basophils Relative: 1 % (ref 0–1)
EOS ABS: 0.2 10*3/uL (ref 0.0–0.7)
Eosinophils Relative: 3 % (ref 0–5)
HCT: 40.3 % (ref 39.0–52.0)
HEMOGLOBIN: 14.1 g/dL (ref 13.0–17.0)
LYMPHS ABS: 2.4 10*3/uL (ref 0.7–4.0)
LYMPHS PCT: 37 % (ref 12–46)
MCH: 31.3 pg (ref 26.0–34.0)
MCHC: 35 g/dL (ref 30.0–36.0)
MCV: 89.4 fL (ref 78.0–100.0)
MONOS PCT: 9 % (ref 3–12)
Monocytes Absolute: 0.6 10*3/uL (ref 0.1–1.0)
NEUTROS PCT: 50 % (ref 43–77)
Neutro Abs: 3.3 10*3/uL (ref 1.7–7.7)
Platelets: 397 10*3/uL (ref 150–400)
RBC: 4.51 MIL/uL (ref 4.22–5.81)
RDW: 14.1 % (ref 11.5–15.5)
WBC: 6.5 10*3/uL (ref 4.0–10.5)

## 2014-10-31 LAB — BASIC METABOLIC PANEL
Anion gap: 16 — ABNORMAL HIGH (ref 5–15)
BUN: 13 mg/dL (ref 6–23)
CHLORIDE: 104 meq/L (ref 96–112)
CO2: 21 meq/L (ref 19–32)
Calcium: 9.6 mg/dL (ref 8.4–10.5)
Creatinine, Ser: 0.84 mg/dL (ref 0.50–1.35)
GFR calc Af Amer: >90 mL/min (ref >90)
GFR calc non Af Amer: >90 mL/min (ref >90)
GLUCOSE: 86 mg/dL (ref 70–99)
POTASSIUM: 4.5 meq/L (ref 3.7–5.3)
Sodium: 141 mEq/L (ref 137–147)

## 2014-10-31 LAB — URINALYSIS, ROUTINE W REFLEX MICROSCOPIC
Bilirubin Urine: NEGATIVE
GLUCOSE, UA: NEGATIVE mg/dL
Hgb urine dipstick: NEGATIVE
Ketones, ur: NEGATIVE mg/dL
Leukocytes, UA: NEGATIVE
Nitrite: NEGATIVE
PH: 5 (ref 5.0–8.0)
Protein, ur: NEGATIVE mg/dL
SPECIFIC GRAVITY, URINE: 1.012 (ref 1.005–1.030)
Urobilinogen, UA: 0.2 mg/dL (ref 0.0–1.0)

## 2014-10-31 LAB — HEPATIC FUNCTION PANEL
ALT: 29 U/L (ref 0–53)
AST: 29 U/L (ref 0–37)
Albumin: 4.3 g/dL (ref 3.5–5.2)
Alkaline Phosphatase: 52 U/L (ref 39–117)
BILIRUBIN TOTAL: 0.2 mg/dL — AB (ref 0.3–1.2)
Bilirubin, Direct: <0.2 mg/dL (ref 0.0–0.3)
TOTAL PROTEIN: 8 g/dL (ref 6.0–8.3)

## 2014-10-31 LAB — CBG MONITORING, ED: GLUCOSE-CAPILLARY: 93 mg/dL (ref 70–99)

## 2014-10-31 LAB — LACTIC ACID, PLASMA: LACTIC ACID, VENOUS: 1.3 mmol/L (ref 0.5–2.2)

## 2014-10-31 MED ORDER — VANCOMYCIN HCL IN DEXTROSE 1-5 GM/200ML-% IV SOLN
1000.0000 mg | Freq: Three times a day (TID) | INTRAVENOUS | Status: DC
Start: 2014-10-31 — End: 2014-10-31
  Filled 2014-10-31 (×2): qty 200

## 2014-10-31 MED ORDER — CIPROFLOXACIN IN D5W 400 MG/200ML IV SOLN
400.0000 mg | Freq: Two times a day (BID) | INTRAVENOUS | Status: DC
  Administered 2014-10-31 – 2014-11-02 (×4): 400 mg via INTRAVENOUS
  Filled 2014-10-31 (×5): qty 200

## 2014-10-31 MED ORDER — ONDANSETRON HCL 4 MG/2ML IJ SOLN
4.0000 mg | Freq: Once | INTRAMUSCULAR | Status: AC
  Administered 2014-10-31: 4 mg via INTRAVENOUS
  Filled 2014-10-31: qty 2

## 2014-10-31 MED ORDER — ENOXAPARIN SODIUM 40 MG/0.4ML ~~LOC~~ SOLN
40.0000 mg | SUBCUTANEOUS | Status: DC
  Filled 2014-10-31 (×3): qty 0.4

## 2014-10-31 MED ORDER — HYDROCHLOROTHIAZIDE 25 MG PO TABS
25.0000 mg | ORAL_TABLET | Freq: Every day | ORAL | Status: DC
  Administered 2014-10-31 – 2014-11-02 (×3): 25 mg via ORAL
  Filled 2014-10-31 (×3): qty 1

## 2014-10-31 MED ORDER — GEMFIBROZIL 600 MG PO TABS
600.0000 mg | ORAL_TABLET | Freq: Two times a day (BID) | ORAL | Status: DC
  Administered 2014-10-31 – 2014-11-02 (×4): 600 mg via ORAL
  Filled 2014-10-31 (×6): qty 1

## 2014-10-31 MED ORDER — OXYCODONE-ACETAMINOPHEN 5-325 MG PO TABS
1.0000 | ORAL_TABLET | ORAL | Status: DC | PRN
  Administered 2014-10-31 – 2014-11-02 (×7): 2 via ORAL
  Filled 2014-10-31 (×7): qty 2

## 2014-10-31 MED ORDER — DIPHENHYDRAMINE HCL 50 MG/ML IJ SOLN
25.0000 mg | Freq: Once | INTRAMUSCULAR | Status: AC
  Administered 2014-10-31: 25 mg via INTRAVENOUS
  Filled 2014-10-31: qty 1

## 2014-10-31 MED ORDER — VANCOMYCIN HCL IN DEXTROSE 1-5 GM/200ML-% IV SOLN
1000.0000 mg | INTRAVENOUS | Status: AC
  Administered 2014-10-31: 1000 mg via INTRAVENOUS
  Filled 2014-10-31: qty 200

## 2014-10-31 MED ORDER — IBUPROFEN 600 MG PO TABS: 600.0000 mg | ORAL_TABLET | Freq: Four times a day (QID) | ORAL | Status: DC | PRN

## 2014-10-31 MED ORDER — MORPHINE SULFATE 4 MG/ML IJ SOLN
4.0000 mg | Freq: Once | INTRAMUSCULAR | Status: AC
  Administered 2014-10-31: 4 mg via INTRAVENOUS
  Filled 2014-10-31: qty 1

## 2014-10-31 MED ORDER — DIPHENHYDRAMINE HCL 25 MG PO CAPS
25.0000 mg | ORAL_CAPSULE | Freq: Three times a day (TID) | ORAL | Status: DC | PRN
  Administered 2014-11-01 – 2014-11-02 (×2): 25 mg via ORAL
  Filled 2014-10-31 (×2): qty 1

## 2014-10-31 MED ORDER — VANCOMYCIN HCL IN DEXTROSE 1-5 GM/200ML-% IV SOLN
1000.0000 mg | Freq: Three times a day (TID) | INTRAVENOUS | Status: DC
  Administered 2014-10-31 – 2014-11-02 (×6): 1000 mg via INTRAVENOUS
  Filled 2014-10-31 (×8): qty 200

## 2014-10-31 NOTE — Plan of Care (Signed)
Problem: Phase I Progression Outcomes Goal: Temperature < 102 Outcome: Completed/Met Date Met:  10/31/14

## 2014-10-31 NOTE — ED Provider Notes (Signed)
CSN: [id number]     Arrival date & time 10/31/14  0929 History  This chart was scribed for non-physician practitioner, Roxy Horsemanobert Bernetha Anschutz, PA-C working with Vanetta MuldersScott Zackowski, MD by Greggory StallionKayla Andersen, ED scribe. This patient was seen in room TR09C/TR09C and the patient's care was started at 10:06 AM.   Chief Complaint  Patient presents with  . Otalgia   The history is provided by the patient. No language interpreter was used.    HPI Comments: Spencer Doyle is a 53 y.o. male who presents to the Emergency Department complaining of worsening left ear pain and drainage that started 8 days ago. Pt was evaluated for the same on 10/24/14 and discharged with septra DS and tramadol. He took the antibiotic as prescribed and states it provided relief for 2-3 days then started to hurt and swell again. Denies history of diabetes.   Past Medical History  Diagnosis Date  . Hypertension   . High cholesterol    History reviewed. No pertinent past surgical history. No family history on file. History  Substance Use Topics  . Smoking status: Current Every Day Smoker -- 0.50 packs/day  . Smokeless tobacco: Never Used  . Alcohol Use: Yes     Comment: occasionally    Review of Systems  Constitutional: Negative for fever.  HENT: Positive for ear discharge and ear pain.   Eyes: Negative for redness.  Respiratory: Negative for shortness of breath.   Cardiovascular: Negative for chest pain.  Gastrointestinal: Negative for abdominal distention.  Musculoskeletal: Negative for gait problem.  Skin: Negative for rash.  Neurological: Negative for speech difficulty.  Psychiatric/Behavioral: Negative for confusion.   Allergies  Review of patient's allergies indicates no known allergies.  Home Medications   Prior to Admission medications   Medication Sig Start Date End Date Taking? Authorizing Provider  amoxicillin (AMOXIL) 875 MG tablet Take 1 tablet (875 mg total) by mouth 2 (two) times daily. 04/28/14   Arthor CaptainAbigail  Harris, PA-C  hydrochlorothiazide (HYDRODIURIL) 25 MG tablet Take 25 mg by mouth 2 (two) times daily.    Historical Provider, MD  oxyCODONE-acetaminophen (PERCOCET) 5-325 MG per tablet Take 1-2 tablets by mouth every 4 (four) hours as needed. 04/28/14   Arthor CaptainAbigail Harris, PA-C  sulfamethoxazole-trimethoprim (SEPTRA DS) 800-160 MG per tablet Take 1 tablet by mouth every 12 (twelve) hours. 10/24/14   Courtney A Forcucci, PA-C  traMADol (ULTRAM) 50 MG tablet Take 1 tablet (50 mg total) by mouth every 6 (six) hours as needed. 10/24/14   Courtney A Forcucci, PA-C  vitamin C (ASCORBIC ACID) 500 MG tablet Take 500 mg by mouth daily.    Historical Provider, MD   BP 131/82 mmHg  Pulse 88  Temp(Src) 98.7 F (37.1 C) (Oral)  Resp 18  Ht 6\' 1"  (1.854 m)  Wt 196 lb (88.905 kg)  BMI 25.86 kg/m2  SpO2 99%   Physical Exam  Constitutional: He is oriented to person, place, and time. He appears well-developed. No distress.  HENT:  Head: Normocephalic and atraumatic.  Cellulitis of right auricle as pictured.   Eyes: Conjunctivae and EOM are normal.  Cardiovascular: Normal rate and regular rhythm.   Pulmonary/Chest: Effort normal. No stridor. No respiratory distress.  Abdominal: He exhibits no distension.  Musculoskeletal: He exhibits no edema.  Neurological: He is alert and oriented to person, place, and time.  Skin: Skin is warm and dry.  Psychiatric: He has a normal mood and affect.  Nursing note and vitals reviewed.       ED  Course  Procedures (including critical care time)  DIAGNOSTIC STUDIES: Oxygen Saturation is 99% on RA, normal by my interpretation.    COORDINATION OF CARE: 10:11 AM-Discussed treatment plan which includes blood work and IV antibiotics with pt at bedside and pt agreed to plan. Advised pt that he may have to be admitted for failing out-patient treatment but he stated that he did not want to stay the night. Risks discussed if pt leaves and he voices understanding.   10:40  AM-Dr. Deretha EmoryZackowski saw and evaluated pt. Will do IV antibiotics and admit pt. Pt states he will stay for admission.   Results for orders placed or performed during the hospital encounter of 10/31/14  CBC with Differential  Result Value Ref Range   WBC 6.5 4.0 - 10.5 K/uL   RBC 4.51 4.22 - 5.81 MIL/uL   Hemoglobin 14.1 13.0 - 17.0 g/dL   HCT 16.140.3 09.639.0 - 04.552.0 %   MCV 89.4 78.0 - 100.0 fL   MCH 31.3 26.0 - 34.0 pg   MCHC 35.0 30.0 - 36.0 g/dL   RDW 40.914.1 81.111.5 - 91.415.5 %   Platelets 397 150 - 400 K/uL   Neutrophils Relative % 50 43 - 77 %   Neutro Abs 3.3 1.7 - 7.7 K/uL   Lymphocytes Relative 37 12 - 46 %   Lymphs Abs 2.4 0.7 - 4.0 K/uL   Monocytes Relative 9 3 - 12 %   Monocytes Absolute 0.6 0.1 - 1.0 K/uL   Eosinophils Relative 3 0 - 5 %   Eosinophils Absolute 0.2 0.0 - 0.7 K/uL   Basophils Relative 1 0 - 1 %   Basophils Absolute 0.0 0.0 - 0.1 K/uL  Basic metabolic panel  Result Value Ref Range   Sodium 141 137 - 147 mEq/L   Potassium 4.5 3.7 - 5.3 mEq/L   Chloride 104 96 - 112 mEq/L   CO2 21 19 - 32 mEq/L   Glucose, Bld 86 70 - 99 mg/dL   BUN 13 6 - 23 mg/dL   Creatinine, Ser 7.820.84 0.50 - 1.35 mg/dL   Calcium 9.6 8.4 - 95.610.5 mg/dL   GFR calc non Af Amer >90 >90 mL/min   GFR calc Af Amer >90 >90 mL/min   Anion gap 16 (H) 5 - 15  CBG monitoring, ED  Result Value Ref Range   Glucose-Capillary 93 70 - 99 mg/dL   No results found.    EKG Interpretation None      MDM   Final diagnoses:  Cellulitis of ear, left    Patient seen by and discussed with Dr. Deretha EmoryZackowski, who recommends ENT consultation and admission to medicine.  Will start on Vanc.    10:40 AM Discussed with Dr. Suszanne Connerseoh, who agrees with the plan.  Dr. Suszanne Connerseoh will consult after admission.  12:16 PM Admit to Internal Medicine Teaching Service.  I personally performed the services described in this documentation, which was scribed in my presence. The recorded information has been reviewed and is accurate.  Roxy Horsemanobert  Ardenia Stiner, PA-C 10/31/14 1216

## 2014-10-31 NOTE — ED Notes (Signed)
3 internal medicine doctors continue at bedside. Report has been called to floor. Awaiting doctors to leave so pt can be transported to floor

## 2014-10-31 NOTE — Plan of Care (Signed)
Problem: Phase II Progression Outcomes Goal: Progress activity as tolerated unless otherwise ordered Outcome: Completed/Met Date Met:  10/31/14     

## 2014-10-31 NOTE — Plan of Care (Signed)
Problem: Consults Goal: Cellulitis Patient Education See Patient Education Module for education specifics. Outcome: Completed/Met Date Met:  10/31/14

## 2014-10-31 NOTE — H&P (Signed)
Date: 10/31/2014               Patient Name:  Spencer Doyle MRN: [id number]  DOB: 1960/11/29 Age / Sex: 53 y.o., male   PCP: No primary care provider on file.         Medical Service: Internal Medicine Teaching Service         Attending Physician: Dr. Aletta EdouardShilpa Bhardwaj, MD    First Contact: Dr. Loma NewtonNgo Pager: [phone]  Second Contact: Dr. Andrey CampanileWilson Pager: [phone]       After Hours (After 5p/  First Contact Pager: [phone]  weekends / holidays): Second Contact Pager: [phone]   Chief Complaint: ear infection  History of Present Illness:   Patient is a 53 year old gentleman with a history of hypertension and hyperlipidemia who presents with a seven-day history of left ear pain and drainage. He states that this all first started when he noticed a bug bite in his ear. He was able to kill the bug in his ear and identify that it was brown and less than 0.5 cm in size. Patient states that he was initially evaluated on 10/24/2014 and was discharged from the emergency department with Bactrim and tramadol. He states that his ear pain and drainage was improved for about 3 days, but it then got suddenly worse. He states that he has continued to hurt and drain yellow fluid despite continuing to take his oral antibiotics and topical hydrogen peroxide treatments. Patient states that he has never had issues like this before, though he did state that he had severe ear infections 3 years ago that spread down to his throat. He had followed up with a ENT specialist several times but hasn't been back to see them recently. Patient states that he has also noticed an associated bumpy rash on the left side of his neck and states that he has severe aching in the area around his ear running down to his left shoulder and neck. He also states that a couple days ago, he lost vision on the left side of his visual field only in his left eye but it has since resolved. Patient denies that this has ever happened to him before. Patient  also denies any hyperacusis, hearing loss, or tinnitus.  Patient states that several years ago, he suffered a brown recluse spider bite on his left forearm requiring surgeries. He states that [place] but has not returned recently to the same area. Patient otherwise denies any fevers, chills, chest pain, dyspnea, abdominal pain.   Meds: Current Facility-Administered Medications  Medication Dose Route Frequency Provider Last Rate Last Dose  . vancomycin (VANCOCIN) IVPB 1000 mg/200 mL premix  1,000 mg Intravenous Q8H Vanetta MuldersScott Zackowski, MD       Current Outpatient Prescriptions  Medication Sig Dispense Refill  . Omega-3 Fatty Acids (FISH OIL PO) Take 1 capsule by mouth daily.    Marland Kitchen. sulfamethoxazole-trimethoprim (SEPTRA DS) 800-160 MG per tablet Take 1 tablet by mouth every 12 (twelve) hours. 10 tablet 0  . vitamin C (ASCORBIC ACID) 500 MG tablet Take 500 mg by mouth daily.    Marland Kitchen. VITAMIN D, CHOLECALCIFEROL, PO Take 1 capsule by mouth daily.    Marland Kitchen. gemfibrozil (LOPID) 600 MG tablet Take 600 mg by mouth 2 (two) times daily before a meal.    . hydrochlorothiazide (HYDRODIURIL) 25 MG tablet Take 25 mg by mouth daily.       Past Medical History  Diagnosis Date  .  Hypertension   . High cholesterol    History reviewed. No pertinent past surgical history.  Allergies: Allergies as of 10/31/2014  . (No Known Allergies)   No family history on file. History   Social History  . Marital Status: Single    Spouse Name: N/A    Number of Children: N/A  . Years of Education: N/A   Occupational History  . Not on file.   Social History Main Topics  . Smoking status: Current Every Day Smoker -- 0.50 packs/day  . Smokeless tobacco: Never Used  . Alcohol Use: Yes     Comment: occasionally  . Drug Use: No  . Sexual Activity: Not on file   Other Topics Concern  . Not on file   Social History Narrative    Review of Systems: All pertinent ROS as  stated in HPI.   Physical Exam: Blood pressure 170/90, pulse 91, temperature 98.5 F (36.9 C), temperature source Oral, resp. rate 18, height 6\' 1"  (1.854 m), weight 196 lb (88.905 kg), SpO2 100 %. General: resting in bed HEENT: PERRL, EOMI, no scleral icterus, swelling and clear drainage from the external pinna of the left ear, tympanic membrane without effusion or erythema, edema of the canal with some small vesiculation, no significant pain upon manipulation of the pinna, bumpy skin color rash on the left side of neck, exquisite tenderness to palpation in the periauricular region, Cardiac: RRR, no rubs, murmurs or gallops Pulm: clear to auscultation bilaterally, moving normal volumes of air Abd: soft, nontender, nondistended, BS present Ext: warm and well perfused, no pedal edema Neuro: alert and oriented X3, cranial nerves II-XII grossly intact, visual fields intact upon confrontation, no facial droop noted Skin: no rashes or lesions noted Psych: appropriate affect  Lab results: Basic Metabolic Panel:  Recent Labs  28/41/3211/08/08 1012  NA 141  K 4.5  CL 104  CO2 21  GLUCOSE 86  BUN 13  CREATININE 0.84  CALCIUM 9.6   Liver Function Tests: No results for input(s): AST, ALT, ALKPHOS, BILITOT, PROT, ALBUMIN in the last 72 hours. No results for input(s): LIPASE, AMYLASE in the last 72 hours. No results for input(s): AMMONIA in the last 72 hours. CBC:  Recent Labs  10/31/14 1012  WBC 6.5  NEUTROABS 3.3  HGB 14.1  HCT 40.3  MCV 89.4  PLT 397   Cardiac Enzymes: No results for input(s): CKTOTAL, CKMB, CKMBINDEX, TROPONINI in the last 72 hours. BNP: No results for input(s): PROBNP in the last 72 hours. D-Dimer: No results for input(s): DDIMER in the last 72 hours. CBG:  Recent Labs  10/31/14 1100  GLUCAP 93   Hemoglobin A1C: No results for input(s): HGBA1C in the last 72 hours. Fasting Lipid Panel: No results for input(s): CHOL, HDL, LDLCALC, TRIG, CHOLHDL, LDLDIRECT  in the last 72 hours. Thyroid Function Tests: No results for input(s): TSH, T4TOTAL, FREET4, T3FREE, THYROIDAB in the last 72 hours. Anemia Panel: No results for input(s): VITAMINB12, FOLATE, FERRITIN, TIBC, IRON, RETICCTPCT in the last 72 hours. Coagulation: No results for input(s): LABPROT, INR in the last 72 hours. Urine Drug Screen: Drugs of Abuse  No results found for: LABOPIA, COCAINSCRNUR, LABBENZ, AMPHETMU, THCU, LABBARB  Alcohol Level: No results for input(s): ETH in the last 72 hours. Urinalysis: No results for input(s): COLORURINE, LABSPEC, PHURINE, GLUCOSEU, HGBUR, BILIRUBINUR, KETONESUR, PROTEINUR, UROBILINOGEN, NITRITE, LEUKOCYTESUR in the last 72 hours.  Invalid input(s): APPERANCEUR Imaging results:  No results found.  Other results: EKG: pending  Assessment &  Plan by Problem: Active Problems:   Cellulitis of left ear   HTN (hypertension)  Patient is a 53 year old gentleman with a history of hypertension and hyperlipidemia who presents with left ear pain and drainage suspicious for a bacterial infection of unclear etiology  Left ear infection: Patient reports a history of a bug bite which precipitated all of his symptoms. However, patient also has exquisite tenderness and a bumpy rash in a C2 dermatomal distribution which could represent a bacterial superinfection of herpes zoster. No associated neurological deficits. However, skin lesions would represent early zoster since there is no significant vesiculation or crusting. There is markedly swelling of the left pinna. Vancomycin was started in the emergency department. -Continue on vancomycin -Start ciprofloxacin per ENT, ENT to see possibly tomorrow -Percocets 1-2 tablets 5/25 every 4 hours when necessary -HIV antibody  Hypertension: Patient has been initially hypertensive in the 150s to 170s systolic in the emergency department. Likely secondary to pain. -Pain control as above. -Continue hydrochlorothiazide 25  mg daily  Hyperlipidemia: No lipid panel in our system. -Continue home gemfibrozil 600 mg twice a day  Diet: Heart Prophylaxis: Lovenox Code: Full code  Dispo: Disposition is deferred at this time, awaiting improvement of current medical problems. Anticipated discharge in approximately 2 day(s).   The patient does have a current PCP (No primary care provider on file.) ([facility]) and does need an [facility] hospital follow-up appointment after discharge.  The patient does not have transportation limitations that hinder transportation to clinic appointments.  Signed: Harold Barban, M.D., Ph.D. Internal Medicine Teaching Service, PGY-1 10/31/2014, 1:16 PM

## 2014-10-31 NOTE — Plan of Care (Signed)
Problem: Consults Goal: Nutrition Consult-if indicated Outcome: Not Applicable Date Met:  10/31/14     

## 2014-10-31 NOTE — Progress Notes (Signed)
NURSING PROGRESS NOTE  Spencer Doyle [id number] Admission Data: 10/31/2014 2:37 PM Attending Provider: Aletta EdouardShilpa Bhardwaj, MD PCP:No primary care provider on file. Code Status: full  Spencer Doyle is a 53 y.o. male patient admitted from ED:  -No acute distress noted.  -No complaints of shortness of breath.  -No complaints of chest pain.     Blood pressure 158/93, pulse 84, temperature 98.9 F (37.2 C), temperature source Oral, resp. rate 18, height 6\' 1"  (1.854 m), weight 88.905 kg (196 lb), SpO2 94 %.   IV Fluids:  IV in place, occlusive dsg intact without redness, IV cath hand right, condition patent and no redness normal saline lock.   Allergies:  Review of patient's allergies indicates no known allergies.  Past Medical History:   has a past medical history of Hypertension; High cholesterol; and Tobacco abuse.  Past Surgical History:   has no past surgical history on file.  Social History:   reports that he has been smoking.  He has never used smokeless tobacco. He reports that he drinks alcohol. He reports that he does not use illicit drugs.  Skin: cellulitis Left ear  Patient/Family orientated to room. Information packet given to patient/family. Admission inpatient armband information verified with patient/family to include name and date of birth and placed on patient arm. Side rails up x 2, fall assessment and education completed with patient/family. Patient/family able to verbalize understanding of risk associated with falls and verbalized understanding to call for assistance before getting out of bed. Call light within reach. Patient/family able to voice and demonstrate understanding of unit orientation instructions.    Will continue to evaluate and treat per MD orders.

## 2014-10-31 NOTE — Plan of Care (Signed)
Problem: Phase I Progression Outcomes Goal: Voiding-avoid urinary catheter unless indicated Outcome: Not Applicable Date Met:  15/37/94

## 2014-10-31 NOTE — H&P (Signed)
Date: 10/31/2014               Patient Name:  Spencer Doyle MRN: [id number]  DOB: [birth year] Age / Sex: 53 y.o., male   PCP: No primary care provider on file.              Medical Service: Internal Medicine Teaching Service              Attending Physician: Dr. Aletta EdouardShilpa Bhardwaj, MD    First Contact: Dr. Harold BarbanLawrence, Ngo, MD Pager: [phone]  Second Contact: Dr. Evelena PeatAlex Wilson, MD Pager: [phone]  Third Contact Harlan StainsGeorge Rebie Peale, Medical Student Pager: [phone]       After Hours (After 5p/  First Contact Pager: [phone]  weekends / holidays): Second Contact Pager: [phone]   Chief Complaint: painful and oozy left ear  History of Present Illness:  Spencer Doyle is a 53 yo male with past medical history of hypertension and hyperlipidemia presenting with pain, swelling, and oozing left ear.  He came to the emergency room on 12/2 because he had a 1 week history of ear pain after noted killing a bug that flew in his ear.  He was treated with Septra for cellulitis. He was feeling better for about three days when he than began to experience more discharge and stated that it was scabbing up.  He used hydrogen peroxide and neosporin for the scabbing but this did not help.  Today he came in because he said the pain is about 10/10 and continues to have yellow-brown drainage.  The pain extends about 2 inches past circumferentially around his ear and down to the upper left part of his neck.  He stated having some non-purulent left eye drainage and vision lost in his temporal side of the left eye which has since resolved. No fever, chills, nausea, or vomiting, no changes in hearing, no tinnitus.   Of note, he mentioned having being bit years ago by a brown recluse spider on his left arm when he was in [place] and required surgery. He says his ear feels the exact same way.  He denies any recent travel history and activities in the woods.    He also noted having non vesicular bumps on both sides of his lateral  neck. These have appeared within the last 24 hours and are very itchy.    Meds: Current Facility-Administered Medications  Medication Dose Route Frequency Provider Last Rate Last Dose  . diphenhydrAMINE (BENADRYL) capsule 25 mg  25 mg Oral Q8H PRN Annett Gularacy N McLean, MD      . enoxaparin (LOVENOX) injection 40 mg  40 mg Subcutaneous Q24H Annett Gularacy N McLean, MD      . gemfibrozil (LOPID) tablet 600 mg  600 mg Oral BID AC Annett Gularacy N McLean, MD      . hydrochlorothiazide (HYDRODIURIL) tablet 25 mg  25 mg Oral Daily Annett Gularacy N McLean, MD      . oxyCODONE-acetaminophen (PERCOCET/ROXICET) 5-325 MG per tablet 1-2 tablet  1-2 tablet Oral Q4H PRN Annett Gularacy N McLean, MD   2 tablet at 10/31/14 1443  . vancomycin (VANCOCIN) IVPB 1000 mg/200 mL premix  1,000 mg Intravenous Q8H Vanetta MuldersScott Zackowski, MD        Allergies: Allergies as of 10/31/2014  . (No Known Allergies)   Past Medical History  Diagnosis Date  . Hypertension   . High cholesterol   . Tobacco abuse   . Pancreatitis     HX OF   . Cellulitis 10/31/2014  LEFT EAR   History reviewed. No pertinent past surgical history. History reviewed. No pertinent family history. History   Social History  . Marital Status: Single    Spouse Name: N/A    Number of Children: N/A  . Years of Education: N/A   Occupational History  . Not on file.   Social History Main Topics  . Smoking status: Current Every Day Smoker -- 0.50 packs/day for 27 years    Types: Cigarettes  . Smokeless tobacco: Never Used  . Alcohol Use: Yes     Comment: occasionally  . Drug Use: No  . Sexual Activity: Not on file   Other Topics Concern  . Not on file   Social History Narrative   Smokes 1/2 ppd   Drinks at times 2-3 beers per night though not every night   Works doing Building surveyorupholstery       Review of Systems: Constitutional: per HPI Eyes: per HPI Ears, nose, mouth, throat, and face: per HPI Respiratory: negative for cough and wheezing Cardiovascular: negative for chest pain,  dyspnea and palpitations Gastrointestinal: per HPI Genitourinary:negative for dysuria and hematuria Hematologic/lymphatic: negative for bleeding and easy bruising Musculoskeletal:negative for bone pain and muscle weakness Neurological: negative for coordination problems, headaches and weakness  Physical Exam: Blood pressure 158/93, pulse 84, temperature 98.9 F (37.2 C), temperature source Oral, resp. rate 18, height 6\' 1"  (1.854 m), weight 88.905 kg (196 lb), SpO2 94 %. BP 158/93 mmHg  Pulse 84  Temp(Src) 98.9 F (37.2 C) (Oral)  Resp 18  Ht 6\' 1"  (1.854 m)  Wt 88.905 kg (196 lb)  BMI 25.86 kg/m2  SpO2 94% General appearance: Sitting on the bed; in pain Head: atraumatic, tenderness circumferentially around the left ear, unable to palpate posterior cervical lymph nodes Ears: normal TM's and external ear canals both ears and painful to touch, erythema and swelling of the left ear with superficial drainage Lungs: clear to auscultation bilaterally Heart: regular rate and rhythm, S1, S2 normal, no murmur, click, rub or gallop Abdomen: soft, non-tender; bowel sounds normal; no masses,  no organomegaly Neurologic: Grossly normal  Lab results: @LABTEST @  Imaging results:  No results found.  Other results: EKG: pending.  Assessment & Plan by Problem: Active Problems:   Cellulitis of left ear   HTN (hypertension)  Potential Cellulitis Differential diagnosis is wide at this point but includes herpes zoster with a superimposed infection, mastoiditis, contact dermatitis, neoplasm, and otitis externa.  Zoster with superimposed infection is likely due to the distrubution of his pain (C2) and bumps (though they are non vesicular). Contact dermatitis is unlikely as he has no history of exposure to allergen. Otitis externa is unlikely as he has no diabetes and no history of immunosuppression. -Start vancomycin -Consult with ENT -HIV test for suspicion of immunosuppression -Percocets 1-2  tablets 5/25 q4h prn Benadryl 25mg   q8h prn -Consider head CT to evaluate for mastoiditis or other internal etiologies  Hypertension  Recent blood pressure is 158/93 -Continue home HCTZ   Hyperlipidemia -Continue home Lopid 600mg   FEN/GI NPO-in case his is to obtain a head CT  Prophylaxis: Lovenox Code: Full Code  Dispo:  Discharge timing is pending improvement of current medical problems.    This is a Psychologist, occupationalMedical Student Note.  The care of the patient was discussed with Dr. Loma NewtonNgo and the assessment and plan was formulated with their assistance.  Please see their note for official documentation of the patient encounter.   Signed: Harlan StainsGeorge Crystin Lechtenberg, Med Student 10/31/2014,  3:25 PM

## 2014-10-31 NOTE — Progress Notes (Signed)
ANTIBIOTIC CONSULT NOTE - INITIAL  Pharmacy Consult for Vancomycin Indication: cellulitis (L ear)  No Known Allergies  Patient Measurements: Height: 6\' 1"  (185.4 cm) Weight: 196 lb (88.905 kg) IBW/kg (Calculated) : 79.9  Vital Signs: Temp: 98.7 F (37.1 C) (12/09 0935) Temp Source: Oral (12/09 0935) BP: 131/82 mmHg (12/09 0935) Pulse Rate: 88 (12/09 0935) Intake/Output from previous day:   Intake/Output from this shift:    Labs:  Recent Labs  10/31/14 1012  WBC 6.5  HGB 14.1  PLT 397  CREATININE 0.84   Estimated Creatinine Clearance: 114.9 mL/min (by C-G formula based on Cr of 0.84). No results for input(s): VANCOTROUGH, VANCOPEAK, VANCORANDOM, GENTTROUGH, GENTPEAK, GENTRANDOM, TOBRATROUGH, TOBRAPEAK, TOBRARND, AMIKACINPEAK, AMIKACINTROU, AMIKACIN in the last 72 hours.   Microbiology: No results found for this or any previous visit (from the past 720 hour(s)).  Medical History: Past Medical History  Diagnosis Date  . Hypertension   . High cholesterol     Medications:  See electronic med rec  Assessment: 53 y.o. male presents with L ear infection. Has been on Bactrim since 12/2 with no evidence of improvement. Still with significant swelling of pinna and drainage. Possible relationship to a bug bite. ENT  Consulted. Estimated CrCl >100 ml/min.  Goal of Therapy:  Vancomycin trough level 10-15 mcg/ml  Plan:  1) Vancomycin 1gm IV q8h 2) Will f/u renal function, pt's clinical condition, micro data 3) Vanc trough prn  Christoper Fabianaron Kiernan Farkas, PharmD, BCPS Clinical pharmacist, pager [phone] 10/31/2014,12:02 PM

## 2014-10-31 NOTE — Progress Notes (Signed)
RN called for report.  

## 2014-10-31 NOTE — Plan of Care (Signed)
Problem: Phase I Progression Outcomes Goal: OOB as tolerated unless otherwise ordered Outcome: Completed/Met Date Met:  10/31/14

## 2014-10-31 NOTE — ED Notes (Signed)
Left ear pain for 8 days. Seen here at the onset of pain 8 days ago. Pt given Septra. States no improvement.

## 2014-10-31 NOTE — Plan of Care (Signed)
Problem: Phase I Progression Outcomes Goal: Pain controlled with appropriate interventions Outcome: Completed/Met Date Met:  10/31/14     

## 2014-10-31 NOTE — Progress Notes (Addendum)
ANTIBIOTIC CONSULT NOTE - INITIAL  Pharmacy Consult for Vancomycin and Cipro Indication: cellulitis  No Known Allergies  Patient Measurements: Height: 6\' 1"  (185.4 cm) Weight: 196 lb (88.905 kg) IBW/kg (Calculated) : 79.9  Vital Signs: Temp: 98.9 F (37.2 C) (12/09 1417) Temp Source: Oral (12/09 1417) BP: 158/93 mmHg (12/09 1417) Pulse Rate: 84 (12/09 1417) Intake/Output from previous day:   Intake/Output from this shift:    Labs:  Recent Labs  10/31/14 1012  WBC 6.5  HGB 14.1  PLT 397  CREATININE 0.84   Estimated Creatinine Clearance: 114.9 mL/min (by C-G formula based on Cr of 0.84). No results for input(s): VANCOTROUGH, VANCOPEAK, VANCORANDOM, GENTTROUGH, GENTPEAK, GENTRANDOM, TOBRATROUGH, TOBRAPEAK, TOBRARND, AMIKACINPEAK, AMIKACINTROU, AMIKACIN in the last 72 hours.   Microbiology: No results found for this or any previous visit (from the past 720 hour(s)).  Medical History: Past Medical History  Diagnosis Date  . Hypertension   . High cholesterol   . Tobacco abuse   . Pancreatitis     HX OF   . Cellulitis 10/31/2014    LEFT EAR    Medications:  Prescriptions prior to admission  Medication Sig Dispense Refill Last Dose  . Omega-3 Fatty Acids (FISH OIL PO) Take 1 capsule by mouth daily.   Past Month at Unknown time  . sulfamethoxazole-trimethoprim (SEPTRA DS) 800-160 MG per tablet Take 1 tablet by mouth every 12 (twelve) hours. 10 tablet 0 10/30/2014 at AM  . vitamin C (ASCORBIC ACID) 500 MG tablet Take 500 mg by mouth daily.   Past Month at Unknown time  . VITAMIN D, CHOLECALCIFEROL, PO Take 1 capsule by mouth daily.   Past Month at Unknown time  . gemfibrozil (LOPID) 600 MG tablet Take 600 mg by mouth 2 (two) times daily before a meal.   Past month  . hydrochlorothiazide (HYDRODIURIL) 25 MG tablet Take 25 mg by mouth daily.    Past month   Scheduled:  . enoxaparin (LOVENOX) injection  40 mg Subcutaneous Q24H  . gemfibrozil  600 mg Oral BID AC  .  hydrochlorothiazide  25 mg Oral Daily  . vancomycin  1,000 mg Intravenous Q8H   Assessment: 53yo male presents with painful, swollen, oozing left ear. Pharmacy is consulted to dose vancomycin and cipro for cellulitis. Pt is afebrile, WBC wnl, sCr 0.84 with CrCl > 100 mL/min.  Goal of Therapy:  Vancomycin trough level 10-15 mcg/ml  Plan:  Vancomycin 1000mg  IV q8h Cipro 400mg  IV q12h Measure antibiotic drug levels at steady state Follow up culture results, renal function, and clinical course  Arlean HoppingCorey M. Newman PiesBall, PharmD Clinical Pharmacist Pager [phone] 10/31/2014,7:28 PM

## 2014-10-31 NOTE — ED Notes (Signed)
ONLY 2 MG OF MORPHINE GIVEN DUE TO EXTREME NAUSEA. SUBSIDED AFTER ZOFRAN.

## 2014-10-31 NOTE — Plan of Care (Signed)
Problem: Consults Goal: Skin Care Protocol Initiated - if Braden Score 18 or less If consults are not indicated, leave blank or document N/A  Outcome: Not Applicable Date Met:  10/31/14     

## 2014-10-31 NOTE — Plan of Care (Signed)
Problem: Consults Goal: Diabetes Guidelines if Diabetic/Glucose > 140 If diabetic or lab glucose is > 140 mg/dl - Initiate Diabetes/Hyperglycemia Guidelines & Document Interventions  Outcome: Not Applicable Date Met:  10/31/14     

## 2014-10-31 NOTE — ED Notes (Signed)
Internal Medicine Doctors at bedside. Waiting to transport patient to floor.

## 2014-10-31 NOTE — ED Notes (Signed)
MD at bedside. Dr. Saul FordyceZacowski.

## 2014-10-31 NOTE — ED Provider Notes (Signed)
Medical screening examination/treatment/procedure(s) were conducted as a shared visit with non-physician practitioner(s) and myself.  I personally evaluated the patient during the encounter.   EKG Interpretation None      Patient with left ear infection over the last 8 days. Patient's been on Septra. No evidence of improvement. Here to day significant swelling of the pinna no purulent drainage. Possible relationship to a bug bite. No trauma. Patient may benefit from being switched to tetracycline but looking at the extent of this and how long it's been going on maybe better to bring him in for some IV antibiotics. Will discuss with air nose and throat to get an opinion may require medical admission.  Vanetta MuldersScott Lucile Didonato, MD 10/31/14 1045

## 2014-11-01 ENCOUNTER — Observation Stay (HOSPITAL_COMMUNITY): Payer: 59

## 2014-11-01 DIAGNOSIS — H938X9 Other specified disorders of ear, unspecified ear: Secondary | ICD-10-CM | POA: Insufficient documentation

## 2014-11-01 DIAGNOSIS — H61039 Chondritis of external ear, unspecified ear: Secondary | ICD-10-CM | POA: Diagnosis present

## 2014-11-01 DIAGNOSIS — H6092 Unspecified otitis externa, left ear: Secondary | ICD-10-CM

## 2014-11-01 DIAGNOSIS — H6690 Otitis media, unspecified, unspecified ear: Secondary | ICD-10-CM | POA: Diagnosis present

## 2014-11-01 DIAGNOSIS — F1010 Alcohol abuse, uncomplicated: Secondary | ICD-10-CM

## 2014-11-01 LAB — BASIC METABOLIC PANEL
Anion gap: 12 (ref 5–15)
BUN: 13 mg/dL (ref 6–23)
CO2: 26 meq/L (ref 19–32)
Calcium: 9.5 mg/dL (ref 8.4–10.5)
Chloride: 98 mEq/L (ref 96–112)
Creatinine, Ser: 0.97 mg/dL (ref 0.50–1.35)
GFR calc non Af Amer: >90 mL/min (ref >90)
Glucose, Bld: 91 mg/dL (ref 70–99)
Potassium: 4.4 mEq/L (ref 3.7–5.3)
SODIUM: 136 meq/L — AB (ref 137–147)

## 2014-11-01 LAB — HIV ANTIBODY (ROUTINE TESTING W REFLEX): HIV: NONREACTIVE

## 2014-11-01 MED ORDER — CIPROFLOXACIN-DEXAMETHASONE 0.3-0.1 % OT SUSP
4.0000 [drp] | Freq: Two times a day (BID) | OTIC | Status: DC
  Administered 2014-11-01 – 2014-11-02 (×3): 4 [drp] via OTIC
  Filled 2014-11-01: qty 7.5

## 2014-11-01 MED ORDER — IOHEXOL 300 MG/ML  SOLN
100.0000 mL | Freq: Once | INTRAMUSCULAR | Status: AC | PRN
  Administered 2014-11-01: 100 mL via INTRAVENOUS

## 2014-11-01 NOTE — Consult Note (Signed)
Reason for Consult: Left ear swelling Referring Physician: Fredia Sorrow, MD  HPI:  Spencer Doyle is an 53 y.o. male who was admitted yesterday for treatment of his persistent left ear infection. He first came to the emergency room on 12/2 because he had a 1 week history of left ear pain, after noted killing a bug that flew in his ear. He was treated with Septra for cellulitis. He was feeling better for about three days. He subsequently experienced more left ear pain and drainage.  He used hydrogen peroxide and neosporin for the scabbing but it did not help.  No fever, chills, nausea, or vomiting, no changes in hearing, no tinnitus.No previous history of otologic surgery    Past Medical History  Diagnosis Date  . Hypertension   . High cholesterol   . Tobacco abuse   . Pancreatitis     HX OF   . Cellulitis 10/31/2014    LEFT EAR    History reviewed. No pertinent past surgical history.  History reviewed. No pertinent family history.  Social History:  reports that he has been smoking Cigarettes.  He has a 13.5 pack-year smoking history. He has never used smokeless tobacco. He reports that he drinks alcohol. He reports that he does not use illicit drugs.  Allergies: No Known Allergies  Prior to Admission medications   Medication Sig Start Date End Date Taking? Authorizing Provider  Omega-3 Fatty Acids (FISH OIL PO) Take 1 capsule by mouth daily.   Yes Historical Provider, MD  sulfamethoxazole-trimethoprim (SEPTRA DS) 800-160 MG per tablet Take 1 tablet by mouth every 12 (twelve) hours. 10/24/14  Yes Courtney A Forcucci, PA-C  vitamin C (ASCORBIC ACID) 500 MG tablet Take 500 mg by mouth daily.   Yes Historical Provider, MD  VITAMIN D, CHOLECALCIFEROL, PO Take 1 capsule by mouth daily.   Yes Historical Provider, MD  gemfibrozil (LOPID) 600 MG tablet Take 600 mg by mouth 2 (two) times daily before a meal.    Historical Provider, MD  hydrochlorothiazide (HYDRODIURIL) 25 MG tablet Take 25  mg by mouth daily.     Historical Provider, MD    Medications:  I have reviewed the patient's current medications. Scheduled: . ciprofloxacin  400 mg Intravenous Q12H  . enoxaparin (LOVENOX) injection  40 mg Subcutaneous Q24H  . gemfibrozil  600 mg Oral BID AC  . hydrochlorothiazide  25 mg Oral Daily  . vancomycin  1,000 mg Intravenous Q8H   DXI:[id number], oxyCODONE-acetaminophen  Results for orders placed or performed during the hospital encounter of 10/31/14 (from the past 48 hour(s))  CBC with Differential     Status: None   Collection Time: 10/31/14 10:12 AM  Result Value Ref Range   WBC 6.5 4.0 - 10.5 K/uL   RBC 4.51 4.22 - 5.81 MIL/uL   Hemoglobin 14.1 13.0 - 17.0 g/dL   HCT 40.3 39.0 - 52.0 %   MCV 89.4 78.0 - 100.0 fL   MCH 31.3 26.0 - 34.0 pg   MCHC 35.0 30.0 - 36.0 g/dL   RDW 14.1 11.5 - 15.5 %   Platelets 397 150 - 400 K/uL   Neutrophils Relative % 50 43 - 77 %   Neutro Abs 3.3 1.7 - 7.7 K/uL   Lymphocytes Relative 37 12 - 46 %   Lymphs Abs 2.4 0.7 - 4.0 K/uL   Monocytes Relative 9 3 - 12 %   Monocytes Absolute 0.6 0.1 - 1.0 K/uL   Eosinophils Relative 3 0 - 5 %  Eosinophils Absolute 0.2 0.0 - 0.7 K/uL   Basophils Relative 1 0 - 1 %   Basophils Absolute 0.0 0.0 - 0.1 K/uL  Basic metabolic panel     Status: Abnormal   Collection Time: 10/31/14 10:12 AM  Result Value Ref Range   Sodium 141 137 - 147 mEq/L   Potassium 4.5 3.7 - 5.3 mEq/L   Chloride 104 96 - 112 mEq/L   CO2 21 19 - 32 mEq/L   Glucose, Bld 86 70 - 99 mg/dL   BUN 13 6 - 23 mg/dL   Creatinine, Ser 0.84 0.50 - 1.35 mg/dL   Calcium 9.6 8.4 - 10.5 mg/dL   GFR calc non Af Amer >90 >90 mL/min   GFR calc Af Amer >90 >90 mL/min    Comment: (NOTE) The eGFR has been calculated using the CKD EPI equation. This calculation has not been validated in all clinical situations. eGFR's persistently <90 mL/min signify possible Chronic Kidney Disease.    Anion gap 16 (H) 5 - 15  Hepatic function panel      Status: Abnormal   Collection Time: 10/31/14 10:12 AM  Result Value Ref Range   Total Protein 8.0 6.0 - 8.3 g/dL   Albumin 4.3 3.5 - 5.2 g/dL   AST 29 0 - 37 U/L   ALT 29 0 - 53 U/L   Alkaline Phosphatase 52 39 - 117 U/L   Total Bilirubin 0.2 (L) 0.3 - 1.2 mg/dL   Bilirubin, Direct <0.2 0.0 - 0.3 mg/dL   Indirect Bilirubin NOT CALCULATED 0.3 - 0.9 mg/dL  CBG monitoring, ED     Status: None   Collection Time: 10/31/14 11:00 AM  Result Value Ref Range   Glucose-Capillary 93 70 - 99 mg/dL  Urinalysis, Routine w reflex microscopic     Status: None   Collection Time: 10/31/14 12:55 PM  Result Value Ref Range   Color, Urine YELLOW YELLOW   APPearance CLEAR CLEAR   Specific Gravity, Urine 1.012 1.005 - 1.030   pH 5.0 5.0 - 8.0   Glucose, UA NEGATIVE NEGATIVE mg/dL   Hgb urine dipstick NEGATIVE NEGATIVE   Bilirubin Urine NEGATIVE NEGATIVE   Ketones, ur NEGATIVE NEGATIVE mg/dL   Protein, ur NEGATIVE NEGATIVE mg/dL   Urobilinogen, UA 0.2 0.0 - 1.0 mg/dL   Nitrite NEGATIVE NEGATIVE   Leukocytes, UA NEGATIVE NEGATIVE    Comment: MICROSCOPIC NOT DONE ON URINES WITH NEGATIVE PROTEIN, BLOOD, LEUKOCYTES, NITRITE, OR GLUCOSE <1000 mg/dL.  Lactic acid, plasma     Status: None   Collection Time: 10/31/14  1:10 PM  Result Value Ref Range   Lactic Acid, Venous 1.3 0.5 - 2.2 mmol/L  Basic metabolic panel     Status: Abnormal   Collection Time: 11/01/14  7:39 AM  Result Value Ref Range   Sodium 136 (L) 137 - 147 mEq/L   Potassium 4.4 3.7 - 5.3 mEq/L   Chloride 98 96 - 112 mEq/L   CO2 26 19 - 32 mEq/L   Glucose, Bld 91 70 - 99 mg/dL   BUN 13 6 - 23 mg/dL   Creatinine, Ser 0.97 0.50 - 1.35 mg/dL   Calcium 9.5 8.4 - 10.5 mg/dL   GFR calc non Af Amer >90 >90 mL/min   GFR calc Af Amer >90 >90 mL/min    Comment: (NOTE) The eGFR has been calculated using the CKD EPI equation. This calculation has not been validated in all clinical situations. eGFR's persistently <90 mL/min signify possible  Chronic Kidney  Disease.    Anion gap 12 5 - 15    Review of Systems  Constitutional: Negative for fever.  HENT: Positive for ear discharge and ear pain.  Eyes: Negative for redness.  Respiratory: Negative for shortness of breath.  Cardiovascular: Negative for chest pain.  Gastrointestinal: Negative for abdominal distention.  Musculoskeletal: Negative for gait problem.  Skin: Negative for rash.  Neurological: Negative for speech difficulty.  Psychiatric/Behavioral: Negative for confusion.   Blood pressure 128/81, pulse 68, temperature 97.9 F (36.6 C), temperature source Oral, resp. rate 18, height _0  (1.854 m), weight 191 lb 1.6 oz (86.682 kg), SpO2 100 %.  Physical Exam  Constitutional: He is oriented to person, place, and time. He appears well-developed. No distress.  HENT:  Head: Normocephalic and atraumatic.  Ears: Swollen and tender left auricle. The left EAC is also edematous. The left TM could not be visualized. The right auricle, EAC, TM are normal. Nose, mouth: Normal mucosa, no lesion.  Eyes: Conjunctivae and EOM are normal.  Cardiovascular: Normal rate and regular rhythm.  Pulmonary/Chest: Effort normal. No stridor. No respiratory distress.  Musculoskeletal: He exhibits no edema.  Neurological: He is alert and oriented to person, place, and time.  Skin: Skin is warm and dry.  Psychiatric: He has a normal mood and affect.  Assessment/Plan: Acute left otitis externa and auricular chondritis. Continue IV abx. Please add ciprodex ear drops 4 drops left ear BID. Will follow.  Markcus Lazenby,SUI W 11/01/2014, 9:16 AM

## 2014-11-01 NOTE — Progress Notes (Signed)
Subjective: Doing better. He continues to mention pain. He said his pain has now moved extended from the upper portion of his left lateral neck down to the base of his shoulder.  He also mentioned having nasal congestion for the past 2 weeks. He has no cough, runny nose, or sore throat.    Objective: Vital signs in last 24 hours: Filed Vitals:   10/31/14 1417 10/31/14 2143 11/01/14 0337 11/01/14 0507  BP: 158/93 144/81  128/81  Pulse: 84 75  68  Temp: 98.9 F (37.2 C) 98.4 F (36.9 C)  97.9 F (36.6 C)  TempSrc: Oral Oral  Oral  Resp: 18 18  18   Height: 6\' 1"  (1.854 m)     Weight: 88.905 kg (196 lb)  86.682 kg (191 lb 1.6 oz)   SpO2: 94% 100%  100%   Weight change:   Intake/Output Summary (Last 24 hours) at 11/01/14 1105 Last data filed at 11/01/14 1018  Gross per 24 hour  Intake    360 ml  Output    300 ml  Net     60 ml   General appearance: Sitting in the chair; in no acute distress Head: anterior cervical lymphadenopathy on the left, Left frontal and maxillary sinuses are tender to palpation Eyes: EOMI Ears: normal TM's and external ear canals both ears Nose: Nares normal. Septum midline. Mucosa normal. No drainage or sinus tenderness., turbinates red Throat: erythematous oral pharynx Neck: unable to palpate for occipital lymphadenopathy as it is too tender  Lungs: clear to auscultation bilaterally Heart: regular rate and rhythm, S1, S2 normal, no murmur, click, rub or gallop Pulses: 2+ and symmetric Neurologic: numbness on the left in V1, V2, and V3 in comparison to the right; all other CN's intact Lab Results: Basic Metabolic Panel:  Recent Labs Lab 10/31/14 1012 11/01/14 0739  NA 141 136*  K 4.5 4.4  CL 104 98  CO2 21 26  GLUCOSE 86 91  BUN 13 13  CREATININE 0.84 0.97  CALCIUM 9.6 9.5   Liver Function Tests:  Recent Labs Lab 10/31/14 1012  AST 29  ALT 29  ALKPHOS 52  BILITOT 0.2*  PROT 8.0  ALBUMIN 4.3   No results for input(s): LIPASE,  AMYLASE in the last 168 hours. No results for input(s): AMMONIA in the last 168 hours. CBC:  Recent Labs Lab 10/31/14 1012  WBC 6.5  NEUTROABS 3.3  HGB 14.1  HCT 40.3  MCV 89.4  PLT 397   Cardiac Enzymes: No results for input(s): CKTOTAL, CKMB, CKMBINDEX, TROPONINI in the last 168 hours. BNP: No results for input(s): PROBNP in the last 168 hours. D-Dimer: No results for input(s): DDIMER in the last 168 hours. CBG:  Recent Labs Lab 10/31/14 1100  GLUCAP 93   Hemoglobin A1C: No results for input(s): HGBA1C in the last 168 hours. Fasting Lipid Panel: No results for input(s): CHOL, HDL, LDLCALC, TRIG, CHOLHDL, LDLDIRECT in the last 168 hours. Thyroid Function Tests: No results for input(s): TSH, T4TOTAL, FREET4, T3FREE, THYROIDAB in the last 168 hours. Coagulation: No results for input(s): LABPROT, INR in the last 168 hours. Anemia Panel: No results for input(s): VITAMINB12, FOLATE, FERRITIN, TIBC, IRON, RETICCTPCT in the last 168 hours. Urine Drug Screen: Drugs of Abuse  No results found for: LABOPIA, COCAINSCRNUR, LABBENZ, AMPHETMU, THCU, LABBARB  Alcohol Level: No results for input(s): ETH in the last 168 hours. Urinalysis:  Recent Labs Lab 10/31/14 1255  COLORURINE YELLOW  LABSPEC 1.012  PHURINE 5.0  GLUCOSEU  NEGATIVE  HGBUR NEGATIVE  BILIRUBINUR NEGATIVE  KETONESUR NEGATIVE  PROTEINUR NEGATIVE  UROBILINOGEN 0.2  NITRITE NEGATIVE  LEUKOCYTESUR NEGATIVE   Micro Results: No results found for this or any previous visit (from the past 240 hour(s)). Studies/Results: No results found. Medications: I have reviewed the patient's current medications. Scheduled Meds: . ciprofloxacin  400 mg Intravenous Q12H  . ciprofloxacin-dexamethasone  4 drop Left Ear BID  . enoxaparin (LOVENOX) injection  40 mg Subcutaneous Q24H  . gemfibrozil  600 mg Oral BID AC  . hydrochlorothiazide  25 mg Oral Daily  . vancomycin  1,000 mg Intravenous Q8H   Continuous Infusions:    PRN Meds:.diphenhydrAMINE, oxyCODONE-acetaminophen Assessment/Plan: Active Problems:   Cellulitis of left ear   HTN (hypertension)   Chondritis of auricle   Otitis   Ear swelling  Left Ear Pain Thought to be otitis externa per ENT.  Still some suspicion of mastoiditis as this would explain the pain around his ear and in the occipital area more so than otitis externa. He's had no fevers and WBC's within normal limits. -Continue vancomycin -Added Cipro -Will obtain CT w/contrast of temporal bone -Continue percocets prn -Continue benadryl prn  Hypertension  Recent blood pressure is 158/93 -Continue home HCTZ   Hyperlipidemia -Continue home Lopid 600mg   FEN/GI -NPO  Prophylaxis: Lovenox Code: Full Code  Dispo: Discharge timing is pending improvement of current medical problems.  This is a Psychologist, occupationalMedical Student Note.  The care of the patient was discussed with Dr. Loma NewtonNgo and the assessment and plan formulated with their assistance.  Please see their attached note for official documentation of the daily encounter.   LOS: 1 day   Harlan StainsGeorge Coralyn Roselli, Med Student 11/01/2014, 11:05 AM

## 2014-11-01 NOTE — Progress Notes (Signed)
UR completed 

## 2014-11-01 NOTE — Progress Notes (Signed)
Subjective:  Patient states that his ear pain and drainage have improved overnight. Patient not reporting any other complaints.  Objective: Vital signs in last 24 hours: Filed Vitals:   10/31/14 1417 10/31/14 2143 11/01/14 0337 11/01/14 0507  BP: 158/93 144/81  128/81  Pulse: 84 75  68  Temp: 98.9 F (37.2 C) 98.4 F (36.9 C)  97.9 F (36.6 C)  TempSrc: Oral Oral  Oral  Resp: 18 18  18   Height: 6\' 1"  (1.854 m)     Weight: 196 lb (88.905 kg)  191 lb 1.6 oz (86.682 kg)   SpO2: 94% 100%  100%   Weight change:   Intake/Output Summary (Last 24 hours) at 11/01/14 0753 Last data filed at 11/01/14 0300  Gross per 24 hour  Intake      0 ml  Output    300 ml  Net   -300 ml   General: resting in chair by window HEENT: PERRL, EOMI, no scleral icterus, swelling and clear drainage from the external pinna of the left ear, bumpy skin color rash on the left side of neck, exquisite tenderness to palpation in the periauricular region,  Cardiac: RRR, no rubs, murmurs or gallops Pulm: clear to auscultation bilaterally, moving normal volumes of air Abd: soft, nontender, nondistended, BS present Ext: warm and well perfused, no pedal edema Neuro: alert and oriented X3, cranial nerves II-XII grossly intact, no droop noted Skin: no rashes or lesions noted Psych: appropriate affect  Lab Results: Basic Metabolic Panel:  Recent Labs Lab 10/31/14 1012  NA 141  K 4.5  CL 104  CO2 21  GLUCOSE 86  BUN 13  CREATININE 0.84  CALCIUM 9.6   Liver Function Tests:  Recent Labs Lab 10/31/14 1012  AST 29  ALT 29  ALKPHOS 52  BILITOT 0.2*  PROT 8.0  ALBUMIN 4.3   No results for input(s): LIPASE, AMYLASE in the last 168 hours. No results for input(s): AMMONIA in the last 168 hours. CBC:  Recent Labs Lab 10/31/14 1012  WBC 6.5  NEUTROABS 3.3  HGB 14.1  HCT 40.3  MCV 89.4  PLT 397   Cardiac Enzymes: No results for input(s): CKTOTAL, CKMB, CKMBINDEX, TROPONINI in the last 168  hours. BNP: No results for input(s): PROBNP in the last 168 hours. D-Dimer: No results for input(s): DDIMER in the last 168 hours. CBG:  Recent Labs Lab 10/31/14 1100  GLUCAP 93   Hemoglobin A1C: No results for input(s): HGBA1C in the last 168 hours. Fasting Lipid Panel: No results for input(s): CHOL, HDL, LDLCALC, TRIG, CHOLHDL, LDLDIRECT in the last 168 hours. Thyroid Function Tests: No results for input(s): TSH, T4TOTAL, FREET4, T3FREE, THYROIDAB in the last 168 hours. Coagulation: No results for input(s): LABPROT, INR in the last 168 hours. Anemia Panel: No results for input(s): VITAMINB12, FOLATE, FERRITIN, TIBC, IRON, RETICCTPCT in the last 168 hours. Urine Drug Screen: Drugs of Abuse  No results found for: LABOPIA, COCAINSCRNUR, LABBENZ, AMPHETMU, THCU, LABBARB  Alcohol Level: No results for input(s): ETH in the last 168 hours. Urinalysis:  Recent Labs Lab 10/31/14 1255  COLORURINE YELLOW  LABSPEC 1.012  PHURINE 5.0  GLUCOSEU NEGATIVE  HGBUR NEGATIVE  BILIRUBINUR NEGATIVE  KETONESUR NEGATIVE  PROTEINUR NEGATIVE  UROBILINOGEN 0.2  NITRITE NEGATIVE  LEUKOCYTESUR NEGATIVE    Micro Results: No results found for this or any previous visit (from the past 240 hour(s)). Studies/Results: No results found. Medications: I have reviewed the patient's current medications. Scheduled Meds: . ciprofloxacin  400 mg Intravenous Q12H  . enoxaparin (LOVENOX) injection  40 mg Subcutaneous Q24H  . gemfibrozil  600 mg Oral BID AC  . hydrochlorothiazide  25 mg Oral Daily  . vancomycin  1,000 mg Intravenous Q8H   Continuous Infusions:  PRN Meds:.diphenhydrAMINE, oxyCODONE-acetaminophen Assessment/Plan: Active Problems:   Cellulitis of left ear   HTN (hypertension)  Patient is a 53 year old gentleman with a history of hypertension and hyperlipidemia who presents with left ear pain and drainage suspicious for a bacterial infection of unclear etiology.   Left otitis  externa with auricular chondritis: ENT following. Vancomycin started in the emergency department.  - Continue with vancomycin, ciprofloxacin added per ENT  - Also add Ciprodex eardrops.  -Percocets 1-2 tablets 5/25 every 4 hours when necessary -HIV antibody pending -CT temporal bones pending  Hypertension: Mostly normotensive today.  -Pain control as above. -Continue hydrochlorothiazide 25 mg daily  Alcohol use: 2-3 beers a day. -Continue to monitor.   Hyperlipidemia: No lipid panel in our system. -Continue home gemfibrozil 600 mg twice a day  Diet: Heart Prophylaxis: Lovenox Code: Full code  Dispo: Disposition is deferred at this time, awaiting improvement of current medical problems. Anticipated discharge in approximately 2 day(s).   The patient does have a current PCP (No primary care provider on file.) ([facility]) and [facility] after [facility].  The patient does not have transportation limitations that hinder transportation to clinic appointments.   LOS: 1 day   Harold BarbanLawrence Onya Eutsler, MD 11/01/2014, 7:53 AM

## 2014-11-02 DIAGNOSIS — H61032 Chondritis of left external ear: Secondary | ICD-10-CM

## 2014-11-02 DIAGNOSIS — I10 Essential (primary) hypertension: Secondary | ICD-10-CM

## 2014-11-02 DIAGNOSIS — H6692 Otitis media, unspecified, left ear: Secondary | ICD-10-CM

## 2014-11-02 DIAGNOSIS — H6012 Cellulitis of left external ear: Principal | ICD-10-CM

## 2014-11-02 LAB — CBC
HCT: 39.7 % (ref 39.0–52.0)
Hemoglobin: 13.6 g/dL (ref 13.0–17.0)
MCH: 30.8 pg (ref 26.0–34.0)
MCHC: 34.3 g/dL (ref 30.0–36.0)
MCV: 90 fL (ref 78.0–100.0)
Platelets: 375 10*3/uL (ref 150–400)
RBC: 4.41 MIL/uL (ref 4.22–5.81)
RDW: 13.9 % (ref 11.5–15.5)
WBC: 5.8 10*3/uL (ref 4.0–10.5)

## 2014-11-02 LAB — BASIC METABOLIC PANEL
Anion gap: 15 (ref 5–15)
BUN: 14 mg/dL (ref 6–23)
CALCIUM: 9.7 mg/dL (ref 8.4–10.5)
CO2: 27 mEq/L (ref 19–32)
Chloride: 96 mEq/L (ref 96–112)
Creatinine, Ser: 1.04 mg/dL (ref 0.50–1.35)
GFR calc non Af Amer: 80 mL/min — ABNORMAL LOW (ref >90)
Glucose, Bld: 92 mg/dL (ref 70–99)
POTASSIUM: 4.1 meq/L (ref 3.7–5.3)
Sodium: 138 mEq/L (ref 137–147)

## 2014-11-02 MED ORDER — OXYCODONE-ACETAMINOPHEN 5-325 MG PO TABS: 1.0000 | ORAL_TABLET | ORAL | Status: DC | PRN

## 2014-11-02 MED ORDER — CIPROFLOXACIN HCL 500 MG PO TABS
500.0000 mg | ORAL_TABLET | Freq: Two times a day (BID) | ORAL | Status: DC
  Filled 2014-11-02 (×2): qty 1

## 2014-11-02 MED ORDER — CIPROFLOXACIN-DEXAMETHASONE 0.3-0.1 % OT SUSP: 4.0000 [drp] | Freq: Two times a day (BID) | OTIC | Status: DC

## 2014-11-02 MED ORDER — CIPROFLOXACIN HCL 500 MG PO TABS: 500.0000 mg | ORAL_TABLET | Freq: Two times a day (BID) | ORAL | Status: AC

## 2014-11-02 NOTE — Discharge Summary (Signed)
Name: Spencer Doyle MRN: [id number] DOB: 01-04-1961 53 y.o. PCP: No primary care provider on file.  Date of Admission: 10/31/2014  9:30 AM Date of Discharge: 11/02/2014 Attending Physician: No att. providers found  Discharge Diagnosis: Active Problems:   Cellulitis of left ear   HTN (hypertension)   Chondritis of auricle   Otitis   Ear swelling  Discharge Medications:   Medication List    STOP taking these medications        sulfamethoxazole-trimethoprim 800-160 MG per tablet  Commonly known as:  SEPTRA DS      TAKE these medications        ciprofloxacin 500 MG tablet  Commonly known as:  CIPRO  Take 1 tablet (500 mg total) by mouth 2 (two) times daily.     ciprofloxacin-dexamethasone otic suspension  Commonly known as:  CIPRODEX  Place 4 drops into the left ear 2 (two) times daily.     FISH OIL PO  Take 1 capsule by mouth daily.     gemfibrozil 600 MG tablet  Commonly known as:  LOPID  Take 600 mg by mouth 2 (two) times daily before a meal.     hydrochlorothiazide 25 MG tablet  Commonly known as:  HYDRODIURIL  Take 25 mg by mouth daily.     oxyCODONE-acetaminophen 5-325 MG per tablet  Commonly known as:  PERCOCET  Take 1-2 tablets by mouth every 4 (four) hours as needed.     vitamin C 500 MG tablet  Commonly known as:  ASCORBIC ACID  Take 500 mg by mouth daily.     VITAMIN D (CHOLECALCIFEROL) PO  Take 1 capsule by mouth daily.        Disposition and follow-up:   Spencer Doyle [facility] in Good condition.  At the hospital follow up visit please address:  1.  Resolution of otitis externa and completion of antibiotic regimen.  2.  Labs / imaging needed at time of follow-up: none  3.  Pending labs/ test needing follow-up: none  Follow-up Appointments:     Follow-up Information    Follow up with Darletta MollEOH,SUI W, MD On 11/08/2014.   Specialty:  Otolaryngology   Why:  Follow up with Dr. Suszanne Connerseoh at 11/08/14 at [facility]    Contact information:   [address] [place] [address] [phone]       Discharge Instructions: Discharge Instructions    Call MD for:  difficulty breathing, headache or visual disturbances    Complete by:  As directed      Call MD for:  extreme fatigue    Complete by:  As directed      Call MD for:  hives    Complete by:  As directed      Call MD for:  persistant dizziness or light-headedness    Complete by:  As directed      Call MD for:  persistant nausea and vomiting    Complete by:  As directed      Call MD for:  redness, tenderness, or signs of infection (pain, swelling, redness, odor or green/yellow discharge around incision site)    Complete by:  As directed      Call MD for:  severe uncontrolled pain    Complete by:  As directed      Call MD for:  temperature >100.4    Complete by:  As directed      Diet - low sodium heart healthy  Complete by:  As directed      Increase activity slowly    Complete by:  As directed            Consultations: Treatment Team:  Darletta Moll, MD  Procedures Performed:  Ct Temporal Bones W/cm  11/01/2014   CLINICAL DATA:  53 year old hypertensive male with history of left ear pain now with drainage not responding to antibiotics. History of left year cellulitis and chondritis of the auricle. Initial encounter.  EXAM: CT TEMPORAL BONES WITH CONTRAST  TECHNIQUE: Axial and coronal plane CT imaging of the petrous temporal bones was performed with thin-collimation image reconstruction after intravenous contrast administration. Multiplanar CT image reconstructions were also generated. CM/KR  CONTRAST:  OMNIPAQUE IOHEXOL 300 MG/ML  SOLN  COMPARISON:  04/28/2014.  FINDINGS: Diffuse inflammation of the left ear/auricle. Mild haziness of adjacent fat planes. Adjacent tiny preauricular lymph nodes. Inflammation extends to the junction with the external auditory canal without extension into the external auditory canal.  Minimal  partial opacification left mastoid air cells with majority of the left mastoid air cells clear.  Middle ear cavities are clear bilaterally. Symmetric normal appearance of the ossicles and labyrinthine structures.  Partial opacification aerated aspect superior left pterygoid plate/left sphenoid sinus. Very minimal mucosal thickening left maxillary sinus.  Visualized intracranial structures are unremarkable.  IMPRESSION: Diffuse inflammation of the left ear/auricle. Mild haziness of adjacent fat planes. Adjacent tiny preauricular lymph nodes. Inflammation extends to the junction with the external auditory canal without extension into the external auditory canal.  Minimal partial opacification left mastoid air cells with majority of the left mastoid air cells clear.  Middle ear cavities are clear bilaterally. Symmetric normal appearance of the ossicles and labyrinthine structures.  Partial opacification aerated aspect superior left pterygoid plate/left sphenoid sinus. Very minimal mucosal thickening left maxillary sinus.   Electronically Signed   By: Bridgett Larsson M.D.   On: 11/01/2014 14:41   Admission HPI:   Patient is a 53 year old gentleman with a history of hypertension and hyperlipidemia who presents with a seven-day history of left ear pain and drainage. He states that this all first started when he noticed a bug bite in his ear. He was able to kill the bug in his ear and identify that it was brown and less than 0.5 cm in size. Patient states that he was initially evaluated on 10/24/2014 and was discharged from the emergency department with Bactrim and tramadol. He states that his ear pain and drainage was improved for about 3 days, but it then got suddenly worse. He states that he has continued to hurt and drain yellow fluid despite continuing to take his oral antibiotics and topical hydrogen peroxide treatments. Patient states that he has never had issues like this before, though he did state that he had  severe ear infections 3 years ago that spread down to his throat. He had followed up with a ENT specialist several times but hasn't been back to see them recently. Patient states that he has also noticed an associated bumpy rash on the left side of his neck and states that he has severe aching in the area around his ear running down to his left shoulder and neck. He also states that a couple days ago, he lost vision on the left side of his visual field only in his left eye but it has since resolved. Patient denies that this has ever happened to him before. Patient also denies any hyperacusis, hearing loss, or  tinnitus.  Patient states that several years ago, he suffered a brown recluse spider bite on his left forearm requiring surgeries. He states that [place] but has not returned recently to the same area. Patient otherwise denies any fevers, chills, chest pain, dyspnea, abdominal pain.   Hospital Course by problem list: Active Problems:   Cellulitis of left ear   HTN (hypertension)   Chondritis of auricle   Otitis   Ear swelling   Left otitis externa with auricular chondritis: Patient with a prior history of preseptal orbital cellulitis on the right earlier this year as well as a history of ear infections. CT of temporal bones showing diffuse inflammation of the left ear and auricle but no further extension of infection into the deep tissues. Patient was previously on Bactrim from a previous evaluation in the emergency department. On this admission, patient was started on vancomycin in the emergency department and ENT was consulted. Per ENT recommendations, IV Cipro was also added as well as Ciprodex eardrops. Patient's pain was managed with Percocets. At discharge, patient's ear pain and drainage was much improved and patient was discharged home with Cipro 500 mg twice a day for a total course of 10 days with an end date of 11/11/2014. Patient to  follow up with ENT in clinic.   Hypertension: Patient was mostly normotensive throughout his hospitalization and his home hydrochlorothiazide 25 mg daily was continued.   Hyperlipidemia:  Patient was continued on his home gemfibrozil 600 mg twice a day.  Discharge Vitals:   BP 156/86 mmHg  Pulse 89  Temp(Src) 98.4 F (36.9 C) (Oral)  Resp 18  Ht 6\' 1"  (1.854 m)  Wt 178 lb (80.74 kg)  BMI 23.49 kg/m2  SpO2 99%  Discharge Labs:  Results for orders placed or performed during the hospital encounter of 10/31/14 (from the past 24 hour(s))  CBC     Status: None   Collection Time: 11/02/14  6:14 AM  Result Value Ref Range   WBC 5.8 4.0 - 10.5 K/uL   RBC 4.41 4.22 - 5.81 MIL/uL   Hemoglobin 13.6 13.0 - 17.0 g/dL   HCT 47.839.7 29.539.0 - 62.152.0 %   MCV 90.0 78.0 - 100.0 fL   MCH 30.8 26.0 - 34.0 pg   MCHC 34.3 30.0 - 36.0 g/dL   RDW 30.813.9 65.711.5 - 84.615.5 %   Platelets 375 150 - 400 K/uL  Basic metabolic panel     Status: Abnormal   Collection Time: 11/02/14  6:14 AM  Result Value Ref Range   Sodium 138 137 - 147 mEq/L   Potassium 4.1 3.7 - 5.3 mEq/L   Chloride 96 96 - 112 mEq/L   CO2 27 19 - 32 mEq/L   Glucose, Bld 92 70 - 99 mg/dL   BUN 14 6 - 23 mg/dL   Creatinine, Ser 9.621.04 0.50 - 1.35 mg/dL   Calcium 9.7 8.4 - 95.210.5 mg/dL   GFR calc non Af Amer 80 (L) >90 mL/min   GFR calc Af Amer >90 >90 mL/min   Anion gap 15 5 - 15    Signed: Harold BarbanLawrence Resha Filippone, MD 11/02/2014, 4:57 PM    Services Ordered on Discharge: none Equipment Ordered on Discharge: none

## 2014-11-02 NOTE — Progress Notes (Signed)
Subjective: Pt reports improvement in his left ear pain and drainage. No hearing difficulty.  Objective: Vital signs in last 24 hours: Temp:  [98.4 F (36.9 C)-98.6 F (37 C)] 98.6 F (37 C) (12/11 0644) Pulse Rate:  [72-78] 76 (12/11 0644) Resp:  [15-16] 16 (12/11 0644) BP: (141-143)/(75-87) 141/87 mmHg (12/11 0644) SpO2:  [98 %-100 %] 100 % (12/11 0644) Weight:  [178 lb (80.74 kg)] 178 lb (80.74 kg) (12/11 [id number])  Physical Exam  Constitutional: He is oriented to person, place, and time. He appears well-developed. No distress.  HENT:  Head: Normocephalic and atraumatic.  Ears: Mildly swollen and tender left auricle. The left EAC edema has decreased. No drainage. The right auricle, EAC, TM are normal. Nose, mouth: Normal mucosa, no lesion.  Eyes: Conjunctivae and EOM are normal.  Cardiovascular: Normal rate and regular rhythm.  Pulmonary/Chest: Effort normal. No stridor. No respiratory distress.  Musculoskeletal: He exhibits no edema.  Neurological: He is alert and oriented to person, place, and time.  Skin: Skin is warm and dry.  Psychiatric: He has a normal mood and affect.  Recent Labs  10/31/14 1012 11/02/14 0614  WBC 6.5 5.8  HGB 14.1 13.6  HCT 40.3 39.7  PLT 397 375    Recent Labs  11/01/14 0739 11/02/14 0614  NA 136* 138  K 4.4 4.1  CL 98 96  CO2 26 27  GLUCOSE 91 92  BUN 13 14  CREATININE 0.97 1.04  CALCIUM 9.5 9.7    Medications:  I have reviewed the patient's current medications. Scheduled: . ciprofloxacin  400 mg Intravenous Q12H  . ciprofloxacin-dexamethasone  4 drop Left Ear BID  . enoxaparin (LOVENOX) injection  40 mg Subcutaneous Q24H  . gemfibrozil  600 mg Oral BID AC  . hydrochlorothiazide  25 mg Oral Daily  . vancomycin  1,000 mg Intravenous Q8H   VWU:[id number]:diphenhydrAMINE, oxyCODONE-acetaminophen  Assessment/Plan: Right auricular chondritis/cellulitis. Clinically improved. Consider d/c home on oral ciprofloxacin (e.g. 500mg  po BID)  for 10 days. Pt may f/u with me as an outpatient in 1 week (phone #[phone]).   LOS: 2 days   Laddie Math,SUI W 11/02/2014, 10:13 AM

## 2014-11-02 NOTE — Progress Notes (Addendum)
Subjective:  Patient reporting continued improvement in ear pain and drainage over the last 2 days. Patient not reporting any complaints and is wondering if he can go home today.  Objective: Vital signs in last 24 hours: Filed Vitals:   11/01/14 0507 11/01/14 1357 11/01/14 2243 11/02/14 0644  BP: 128/81 142/80 143/75 141/87  Pulse: 68 78 72 76  Temp: 97.9 F (36.6 C) 98.6 F (37 C) 98.4 F (36.9 C) 98.6 F (37 C)  TempSrc: Oral Oral Oral Oral  Resp: 18 16 15 16   Height:      Weight:    178 lb (80.74 kg)  SpO2: 100% 100% 98% 100%   Weight change: -18 lb (-8.165 kg)  Intake/Output Summary (Last 24 hours) at 11/02/14 0818 Last data filed at 11/01/14 1401  Gross per 24 hour  Intake    600 ml  Output      0 ml  Net    600 ml   General: resting in chair by window HEENT: PERRL, EOMI, no scleral icterus, swelling and clear drainage from the external pinna of the left ear, resolution of bumpy skin color rash on left side of neck, tenderness to palpation in the periauricular region Cardiac: RRR, no rubs, murmurs or gallops Pulm: clear to auscultation bilaterally, moving normal volumes of air Abd: soft, nontender, nondistended, BS present Ext: warm and well perfused, no pedal edema Neuro: alert and oriented X3, cranial nerves II-XII grossly intact, no droop noted Skin: no rashes or lesions noted Psych: appropriate affect  Lab Results: Basic Metabolic Panel:  Recent Labs Lab 11/01/14 0739 11/02/14 0614  NA 136* 138  K 4.4 4.1  CL 98 96  CO2 26 27  GLUCOSE 91 92  BUN 13 14  CREATININE 0.97 1.04  CALCIUM 9.5 9.7   Liver Function Tests:  Recent Labs Lab 10/31/14 1012  AST 29  ALT 29  ALKPHOS 52  BILITOT 0.2*  PROT 8.0  ALBUMIN 4.3   No results for input(s): LIPASE, AMYLASE in the last 168 hours. No results for input(s): AMMONIA in the last 168 hours. CBC:  Recent Labs Lab 10/31/14 1012 11/02/14 0614  WBC 6.5 5.8  NEUTROABS 3.3  --   HGB 14.1 13.6    HCT 40.3 39.7  MCV 89.4 90.0  PLT 397 375   Cardiac Enzymes: No results for input(s): CKTOTAL, CKMB, CKMBINDEX, TROPONINI in the last 168 hours. BNP: No results for input(s): PROBNP in the last 168 hours. D-Dimer: No results for input(s): DDIMER in the last 168 hours. CBG:  Recent Labs Lab 10/31/14 1100  GLUCAP 93   Hemoglobin A1C: No results for input(s): HGBA1C in the last 168 hours. Fasting Lipid Panel: No results for input(s): CHOL, HDL, LDLCALC, TRIG, CHOLHDL, LDLDIRECT in the last 168 hours. Thyroid Function Tests: No results for input(s): TSH, T4TOTAL, FREET4, T3FREE, THYROIDAB in the last 168 hours. Coagulation: No results for input(s): LABPROT, INR in the last 168 hours. Anemia Panel: No results for input(s): VITAMINB12, FOLATE, FERRITIN, TIBC, IRON, RETICCTPCT in the last 168 hours. Urine Drug Screen: Drugs of Abuse  No results found for: LABOPIA, COCAINSCRNUR, LABBENZ, AMPHETMU, THCU, LABBARB  Alcohol Level: No results for input(s): ETH in the last 168 hours. Urinalysis:  Recent Labs Lab 10/31/14 1255  COLORURINE YELLOW  LABSPEC 1.012  PHURINE 5.0  GLUCOSEU NEGATIVE  HGBUR NEGATIVE  BILIRUBINUR NEGATIVE  KETONESUR NEGATIVE  PROTEINUR NEGATIVE  UROBILINOGEN 0.2  NITRITE NEGATIVE  LEUKOCYTESUR NEGATIVE    Micro Results: No  results found for this or any previous visit (from the past 240 hour(s)). Studies/Results: Ct Temporal Bones W/cm  11/01/2014   CLINICAL DATA:  53 year old hypertensive male with history of left ear pain now with drainage not responding to antibiotics. History of left year cellulitis and chondritis of the auricle. Initial encounter.  EXAM: CT TEMPORAL BONES WITH CONTRAST  TECHNIQUE: Axial and coronal plane CT imaging of the petrous temporal bones was performed with thin-collimation image reconstruction after intravenous contrast administration. Multiplanar CT image reconstructions were also generated. CM/KR  CONTRAST:  Laurell JosWynetta EmeryphsJill Alexan40Dietri99 [place] SugPress pho elLaurell JosWynetta EmeryphsJill Alexan31Dietri691 HoPress photDelford FKoreDarrelyn ilLaurell JosWynetta EmeryphsJill Alexan24Dietri924 aPLaurell JosWynetta EmeryphsJill Alexan68Dietri73 VernPress photDelford FKoreDarrelyn HillocL rLaurell JosWynetta EmeryphsJill Alexan27Dietri4 NewcastPress photDe orLaurell JosWynetta EmeryphsJill Alexan64Dietri8879 MarlboroPress photDelford FKoreDarrely HiLaurell JosWynetta EmeryphsJill Alexan46Dietri73 FoxPre pLaurell JosWynetta EmeryphsJill Alexan18Dietri88 DunbPre pLaurell JosWynetta EmeryphsJill Alexan12Dietri91 HanovPress photD foLaurell JosWynetta EmeryphsJill Alexan38Dietri751 BirchwooPres phLaurell JosWynetta EmeryphsJill Alexan81Dietri8184 BPress photDe orLaurell JosWynetta EmeryphsJill Alexan40Dietri279 ArmstrongPress photDelford FKoreDarrelyn HillocLau llLaurell JosWynetta EmeryphsJill Alexan35Dietri213 [place] JuanPress photDelford FKoreDarrelyn [place] LaLaurell JosWynetta EmeryphsJill Alexan52Dietri22 N. OhiPress photDelford KoLaurell JosWynetta EmeryphsJill Alexan52Dietri8383 HaliPr s Laurell JosWynetta EmeryphsJill Alexan5Dietri71 BriarwPr s Laurell JosWynetta EmeryphsJill Alexan74Dietri470 RosePress photDelfo FLaurell JosWynetta EmeryphsJill Alexan50Dietri418 FairPress photDelford FKoreDarrelyn HillocLau llLaurell JosWynetta EmeryphsJill Alexan74Dietri78 ThoPress photDelford orLaurell JosWynetta EmeryphsJill Alexan9Dietri16 W. Walt WhitPress oLaurell JosWynetta EmeryphsJill Alexan76Dietri7700 ParkerP ssLaurell JosWynetta EmeryphsJill Alexan69Dietri53 AcadPress photDelford FKoreDarrelyn HillocLaurell JoseKermi51GKindred Hospital Arizona - ScottsdaleClin7829 f the left ear/auricle. Mild haziness of adjacent fat planes. Adjacent tiny preauricular lymph nodes. Inflammation extends to the junction with the external auditory canal without extension into the external auditory canal.  Minimal partial opacification left mastoid air cells with majority of the left mastoid air cells clear.  Middle ear cavities are clear bilaterally. Symmetric normal appearance of the ossicles and labyrinthine structures.  Partial opacification aerated aspect superior left pterygoid plate/left sphenoid sinus. Very minimal mucosal thickening left maxillary sinus.  Visualized intracranial structures are unremarkable.  IMPRESSION: Diffuse inflammation of the left ear/auricle. Mild haziness of adjacent fat planes. Adjacent tiny preauricular lymph nodes. Inflammation extends to the junction with the external auditory canal without extension into the external auditory canal.  Minimal partial opacification left mastoid air cells with majority of the left mastoid air cells clear.  Middle ear cavities are clear bilaterally. Symmetric normal appearance of the ossicles and labyrinthine structures.  Partial opacification aerated aspect superior left pterygoid plate/left sphenoid sinus. Very minimal mucosal thickening left maxillary sinus.   Electronically Signed   By: Steve  Olson M.D.   On: 11/01/2014 14:41   Medications: I have reviewed the patient's current medications. Scheduled Meds: . ciprofloxacin  400 mg Intravenous Q12H  . ciprofloxacin-dexamethasone  4 drop Left Ear BID  . enoxaparin (LOVENOX) injection  40 mg Subcutaneous Q24H  . gemfibrozil  600 mg Oral BID AC  . hydrochlorothiazide  25 mg Oral Daily  . vancomycin  1,000 mg Intravenous Q8H   Continuous Infusions:  PRN Meds:.diphenhydrAMINE, oxyCODONE-acetaminophen Assessment/Plan: Active Problems:   Cellulitis of left ear   HTN  (hypertension)   Chondritis of auricle   Otitis   Ear swelling  Patient is a 53 year old gentleman with a history of hypertension and hyperlipidemia who presents with left ear pain and drainage suspicious for a bacterial infection of unclear etiology.   Left otitis externa with auricular chondritis: Patient has a prior history of preseptal orbital cellulitis on the right earlier this year as well as history of ear infections. Interval improvement in swelling and pain. CT temporal bones showing diffuse inflammation of the left ear and auricle. There is minimal partial opacification of the left mastoid air cells. ENT following.  - Discontinue vancomycin  - Cipro 500 mg twice a day PO  - Continue with Ciprodex eardrops  - Percocet 1-2 tablets 5/325 every 4 hours when necessary  - Appreciate ENT recommendations   Hypertension: Continues to be mostly normotensive. -Pain control as above. -Continue hydrochlorothiazide 25 mg daily  Hyperlipidemia: No lipid panel in our system. -Continue home gemfibrozil 600 mg twice a day  Diet: Heart Prophylaxis: Lovenox Code: Full code  Dispo: Disposition is deferred at this time, awaiting improvement of current medical problems. Anticipated discharge in approximately 0-1 day(s).   The patient does have a current PCP (No primary care provider on file.) (  [facility]) and does need an [facility] hospital follow-up appointment after discharge.  The patient does not have transportation limitations that hinder transportation to clinic appointments.   LOS: 2 days   Harold Barban, MD 11/02/2014, 8:18 AM

## 2014-11-02 NOTE — Progress Notes (Signed)
Pt. Received discharge instructions and prescriptions. Educated pt. On follow-up appointments. Removed IV. No skin issues noted. All questions answered. No further needs noted at this time. 

## 2014-11-02 NOTE — Discharge Instructions (Signed)
We have prescribed an antibiotic called ciprofloxacin for you to take for 10 days (end date 11/11/14) and an ointment for your ear called ciprodex. Please follow up with the ENT specialist. You may call the ENT's office to try to arrange for [place] in [place] instead of [place].    Cellulitis Cellulitis is an infection of the skin and the tissue under the skin. The infected area is usually red and tender. This happens most often in the arms and lower legs. HOME CARE   Take your antibiotic medicine as told. Finish the medicine even if you start to feel better.  Keep the infected arm or leg raised (elevated).  Put a warm cloth on the area up to 4 times per day.  Only take medicines as told by your doctor.  Keep all doctor visits as told. GET HELP IF:  You see red streaks on the skin coming from the infected area.  Your red area gets bigger or turns a dark color.  Your bone or joint under the infected area is painful after the skin heals.  Your infection comes back in the same area or different area.  You have a puffy (swollen) bump in the infected area.  You have new symptoms.  You have a fever. GET HELP RIGHT AWAY IF:   You feel very sleepy.  You throw up (vomit) or have watery poop (diarrhea).  You feel sick and have muscle aches and pains. MAKE SURE YOU:   Understand these instructions.  Will watch your condition.  Will get help right away if you are not doing well or get worse. Document Released: 04/27/2008 Document Revised: 03/26/2014 Document Reviewed: 01/25/2012 [facility] Patient Information 2015 [employer] [place]. This information is not intended to replace advice given to you by your health care provider. Make sure you discuss any questions you have with your health care provider.

## 2014-11-08 ENCOUNTER — Ambulatory Visit (INDEPENDENT_AMBULATORY_CARE_PROVIDER_SITE_OTHER): Payer: 59 | Admitting: Otolaryngology

## 2014-11-29 ENCOUNTER — Encounter (HOSPITAL_COMMUNITY): Payer: Self-pay | Admitting: Cardiology

## 2014-11-29 ENCOUNTER — Emergency Department (HOSPITAL_COMMUNITY)
Admission: EM | Admit: 2014-11-29 | Discharge: 2014-11-29 | Disposition: A | Discharge status: Home / Self Care | Payer: MEDICAID | Attending: Emergency Medicine | Admitting: Emergency Medicine

## 2014-11-29 DIAGNOSIS — Z8639 Personal history of other endocrine, nutritional and metabolic disease: Secondary | ICD-10-CM | POA: Insufficient documentation

## 2014-11-29 DIAGNOSIS — Z79899 Other long term (current) drug therapy: Secondary | ICD-10-CM | POA: Insufficient documentation

## 2014-11-29 DIAGNOSIS — I10 Essential (primary) hypertension: Secondary | ICD-10-CM | POA: Insufficient documentation

## 2014-11-29 DIAGNOSIS — Z872 Personal history of diseases of the skin and subcutaneous tissue: Secondary | ICD-10-CM | POA: Insufficient documentation

## 2014-11-29 DIAGNOSIS — Z792 Long term (current) use of antibiotics: Secondary | ICD-10-CM | POA: Insufficient documentation

## 2014-11-29 DIAGNOSIS — Z8719 Personal history of other diseases of the digestive system: Secondary | ICD-10-CM | POA: Insufficient documentation

## 2014-11-29 DIAGNOSIS — Z720 Tobacco use: Secondary | ICD-10-CM | POA: Insufficient documentation

## 2014-11-29 DIAGNOSIS — H61032 Chondritis of left external ear: Secondary | ICD-10-CM | POA: Insufficient documentation

## 2014-11-29 LAB — BASIC METABOLIC PANEL
Anion gap: 10 (ref 5–15)
BUN: 11 mg/dL (ref 6–23)
CALCIUM: 9.3 mg/dL (ref 8.4–10.5)
CHLORIDE: 112 meq/L (ref 96–112)
CO2: 17 mmol/L — ABNORMAL LOW (ref 19–32)
CREATININE: 0.93 mg/dL (ref 0.50–1.35)
GFR calc Af Amer: >90 mL/min (ref >90)
GFR calc non Af Amer: >90 mL/min (ref >90)
GLUCOSE: 87 mg/dL (ref 70–99)
Potassium: 4.4 mmol/L (ref 3.5–5.1)
Sodium: 139 mmol/L (ref 135–145)

## 2014-11-29 LAB — CBC WITH DIFFERENTIAL/PLATELET
BASOS ABS: 0 10*3/uL (ref 0.0–0.1)
Basophils Relative: 0 % (ref 0–1)
EOS ABS: 0.4 10*3/uL (ref 0.0–0.7)
EOS PCT: 5 % (ref 0–5)
HEMATOCRIT: 38.3 % — AB (ref 39.0–52.0)
HEMOGLOBIN: 12.8 g/dL — AB (ref 13.0–17.0)
LYMPHS ABS: 2.7 10*3/uL (ref 0.7–4.0)
Lymphocytes Relative: 39 % (ref 12–46)
MCH: 30.8 pg (ref 26.0–34.0)
MCHC: 33.4 g/dL (ref 30.0–36.0)
MCV: 92.1 fL (ref 78.0–100.0)
MONO ABS: 0.7 10*3/uL (ref 0.1–1.0)
MONOS PCT: 10 % (ref 3–12)
NEUTROS PCT: 46 % (ref 43–77)
Neutro Abs: 3.1 10*3/uL (ref 1.7–7.7)
Platelets: 351 10*3/uL (ref 150–400)
RBC: 4.16 MIL/uL — ABNORMAL LOW (ref 4.22–5.81)
RDW: 14 % (ref 11.5–15.5)
WBC: 6.8 10*3/uL (ref 4.0–10.5)

## 2014-11-29 LAB — I-STAT CG4 LACTIC ACID, ED: Lactic Acid, Venous: 0.8 mmol/L (ref 0.5–2.2)

## 2014-11-29 MED ORDER — CIPROFLOXACIN HCL 500 MG PO TABS: 500.0000 mg | ORAL_TABLET | Freq: Two times a day (BID) | ORAL | Status: DC

## 2014-11-29 MED ORDER — OXYCODONE-ACETAMINOPHEN 5-325 MG PO TABS: 1.0000 | ORAL_TABLET | ORAL | Status: DC | PRN

## 2014-11-29 MED ORDER — CIPROFLOXACIN IN D5W 400 MG/200ML IV SOLN
400.0000 mg | Freq: Once | INTRAVENOUS | Status: DC
  Filled 2014-11-29: qty 200

## 2014-11-29 MED ORDER — CIPROFLOXACIN HCL 500 MG PO TABS
500.0000 mg | ORAL_TABLET | Freq: Once | ORAL | Status: AC
  Administered 2014-11-29: 500 mg via ORAL
  Filled 2014-11-29: qty 1

## 2014-11-29 MED ORDER — SODIUM CHLORIDE 0.9 % IV BOLUS (SEPSIS)
1000.0000 mL | Freq: Once | INTRAVENOUS | Status: AC
  Administered 2014-11-29: 1000 mL via INTRAVENOUS

## 2014-11-29 MED ORDER — MORPHINE SULFATE 4 MG/ML IJ SOLN
4.0000 mg | Freq: Once | INTRAMUSCULAR | Status: AC
  Administered 2014-11-29: 4 mg via INTRAVENOUS
  Filled 2014-11-29: qty 1

## 2014-11-29 MED ORDER — ONDANSETRON HCL 4 MG/2ML IJ SOLN
4.0000 mg | Freq: Once | INTRAMUSCULAR | Status: AC
  Administered 2014-11-29: 4 mg via INTRAVENOUS
  Filled 2014-11-29: qty 2

## 2014-11-29 MED ORDER — CIPROFLOXACIN-DEXAMETHASONE 0.3-0.1 % OT SUSP: 4.0000 [drp] | Freq: Two times a day (BID) | OTIC | Status: DC

## 2014-11-29 MED ORDER — OXYCODONE-ACETAMINOPHEN 5-325 MG PO TABS
2.0000 | ORAL_TABLET | Freq: Once | ORAL | Status: AC
  Administered 2014-11-29: 2 via ORAL
  Filled 2014-11-29: qty 2

## 2014-11-29 NOTE — ED Notes (Signed)
Family at bedside. 

## 2014-11-29 NOTE — Discharge Instructions (Signed)
Otitis Externa/ Auricle chondritis  Otitis externa is a bacterial or fungal infection of the outer ear canal. This is the area from the eardrum to the outside of the ear. Otitis externa is sometimes called "swimmer's ear." CAUSES  Possible causes of infection include:  Swimming in dirty water.  Moisture remaining in the ear after swimming or bathing.  Mild injury (trauma) to the ear.  Objects stuck in the ear (foreign body).  Cuts or scrapes (abrasions) on the outside of the ear. SIGNS AND SYMPTOMS  The first symptom of infection is often itching in the ear canal. Later signs and symptoms may include swelling and redness of the ear canal, ear pain, and yellowish-white fluid (pus) coming from the ear. The ear pain may be worse when pulling on the earlobe. DIAGNOSIS  Your health care provider will perform a physical exam. A sample of fluid may be taken from the ear and examined for bacteria or fungi. TREATMENT  Antibiotic ear drops are often given for 10 to 14 days. Treatment may also include pain medicine or corticosteroids to reduce itching and swelling. HOME CARE INSTRUCTIONS   Apply antibiotic ear drops to the ear canal as prescribed by your health care provider.  Take medicines only as directed by your health care provider.  If you have diabetes, follow any additional treatment instructions from your health care provider.  Keep all follow-up visits as directed by your health care provider. PREVENTION   Keep your ear dry. Use the corner of a towel to absorb water out of the ear canal after swimming or bathing.  Avoid scratching or putting objects inside your ear. This can damage the ear canal or remove the protective wax that lines the canal. This makes it easier for bacteria and fungi to grow.  Avoid swimming in lakes, polluted water, or poorly chlorinated pools.  You may use ear drops made of rubbing alcohol and vinegar after swimming. Combine equal parts of white vinegar and  alcohol in a bottle. Put 3 or 4 drops into each ear after swimming. SEEK MEDICAL CARE IF:   You have a fever.  Your ear is still red, swollen, painful, or draining pus after 3 days.  Your redness, swelling, or pain gets worse.  You have a severe headache.  You have redness, swelling, pain, or tenderness in the area behind your ear. MAKE SURE YOU:   Understand these instructions.  Will watch your condition.  Will get help right away if you are not doing well or get worse. Document Released: 11/09/2005 Document Revised: 03/26/2014 Document Reviewed: 11/26/2011 [facility] Patient Information 2015 [place]. This information is not intended to replace advice given to you by your health care provider. Make sure you discuss any questions you have with your health care provider.

## 2014-11-29 NOTE — ED Notes (Signed)
Pt reports that the left ear has been draining since last night. Pt also reports pain in that ear.

## 2014-11-29 NOTE — ED Notes (Signed)
Patient is alert and orientedx4.  Patient was explained discharge instructions and they understood them with no questions.  The patient's friend, Fabienne BrunsSharon Price is taking the patient home.

## 2014-11-29 NOTE — ED Provider Notes (Signed)
  Face-to-face evaluation   History: He complains of swelling and pain in his left ear, for 4 days.  It is similar to when he had the infection several weeks ago, and was treated here with admission and at home antibiotics.  He thinks he completed about 10 days worth of at home antibiotics.  He did not follow-up with the ENT doctor, as planned.  He has not been vomiting.  Physical exam: Alert, calm, cooperative.  Left ear with mild diffuse swelling of the pinna.  There is a very small area draining clear liquid in the anterior upper inner helix.  There is diffuse skin change of the left ear which extends to the left scalp posteriorly, characterized by thickening, and a rough texture.  There is no area of bleeding about the left ear.  Has very small amount of posterior adenopathy behind the left ear.  Neck is normal range of motion.  Medical screening examination/treatment/procedure(s) were conducted as a shared visit with non-physician practitioner(s) and myself.  I personally evaluated the patient during the encounter  Spencer MelterElliott L Undine Nealis, MD 11/30/14 [phone]

## 2014-11-29 NOTE — ED Provider Notes (Signed)
CSN: [id number]     Arrival date & time 11/29/14  1813 History   First MD Initiated Contact with Patient 11/29/14 1947     Chief Complaint  Patient presents with  . Otalgia   Spencer Bargevery Doyle is a 54 y.o. male with a history of hypertension and left auricle chondritis and cellulits who presents to the emergency department complaining of left ear pain and drainage for the past week and a half. The patient was admitted to the hospital on 10/31/2014 for left carpal chondritis and cellulitis. The patient was given IV Cipro in the hospital and discharged with Cipro 500 mg twice a day for 10 days with an end date of 11/11/2014. The patient reports that after his admission and oral antibiotics he had improvement of his symptoms. The patient reports that just after Christmas he noticed having increased pain and having white discharge from his ear. Patient currently reports his pain is 10 out of 10. He reports his pain improves when lying on his left side. The patient has not been able to follow-up with ENT. Patient reports his hearing is intact. He reports left sided headache. The patient denies fevers, chills, tinnitus, loss of hearing, neck pain, neck stiffness, difficulty swallowing, sore throat.   (Consider location/radiation/quality/duration/timing/severity/associated sxs/prior Treatment) HPI  Past Medical History  Diagnosis Date  . Hypertension   . High cholesterol   . Tobacco abuse   . Pancreatitis     HX OF   . Cellulitis 10/31/2014    LEFT EAR   History reviewed. No pertinent past surgical history. History reviewed. No pertinent family history. History  Substance Use Topics  . Smoking status: Current Every Day Smoker -- 0.50 packs/day for 27 years    Types: Cigarettes  . Smokeless tobacco: Never Used  . Alcohol Use: Yes     Comment: occasionally    Review of Systems  Constitutional: Negative for fever and chills.  HENT: Positive for ear discharge and ear pain. Negative for congestion,  facial swelling, hearing loss, nosebleeds, postnasal drip, rhinorrhea, sinus pressure, sore throat, tinnitus and trouble swallowing.   Eyes: Negative for pain and visual disturbance.  Respiratory: Negative for cough, shortness of breath and wheezing.   Cardiovascular: Negative for chest pain and palpitations.  Gastrointestinal: Negative for nausea, vomiting, abdominal pain and diarrhea.  Genitourinary: Negative for dysuria.  Musculoskeletal: Negative for back pain and neck pain.  Skin: Negative for rash.  Neurological: Negative for dizziness, facial asymmetry, light-headedness, numbness and headaches.  All other systems reviewed and are negative.     Allergies  Review of patient's allergies indicates no known allergies.  Home Medications   Prior to Admission medications   Medication Sig Start Date End Date Taking? Authorizing Provider  ciprofloxacin-dexamethasone (CIPRODEX) otic suspension Place 4 drops into the left ear 2 (two) times daily. 11/02/14  Yes Harold BarbanLawrence Ngo, MD  gemfibrozil (LOPID) 600 MG tablet Take 600 mg by mouth 2 (two) times daily before a meal.   Yes Historical Provider, MD  Omega-3 Fatty Acids (FISH OIL PO) Take 1 capsule by mouth daily.   Yes Historical Provider, MD  oxyCODONE-acetaminophen (PERCOCET) 5-325 MG per tablet Take 1-2 tablets by mouth every 4 (four) hours as needed. Patient taking differently: Take 1-2 tablets by mouth every 4 (four) hours as needed.  11/02/14  Yes Harold BarbanLawrence Ngo, MD  vitamin C (ASCORBIC ACID) 500 MG tablet Take 500 mg by mouth daily.   Yes Historical Provider, MD  VITAMIN D, CHOLECALCIFEROL, PO Take 1 capsule  by mouth daily.   Yes Historical Provider, MD  ciprofloxacin (CIPRO) 500 MG tablet Take 1 tablet (500 mg total) by mouth every 12 (twelve) hours. 11/29/14   Einar Gip Julio Storr, PA-C  ciprofloxacin-dexamethasone (CIPRODEX) otic suspension Place 4 drops into the left ear 2 (two) times daily. 11/29/14   Einar Gip Terita Hejl, PA-C   hydrochlorothiazide (HYDRODIURIL) 25 MG tablet Take 25 mg by mouth daily.     Historical Provider, MD  oxyCODONE-acetaminophen (PERCOCET/ROXICET) 5-325 MG per tablet Take 1-2 tablets by mouth every 4 (four) hours as needed for severe pain. May take 2 tablets PO q 6 hours for severe pain - Do not take with Tylenol as this tablet already contains tylenol 11/29/14   Einar Gip Martavia Tye, PA-C   BP 117/65 mmHg  Pulse 66  Temp(Src) 98.1 F (36.7 C) (Oral)  Resp 18  Ht  (1.854 m)  Wt 196 lb (88.905 kg)  BMI 25.86 kg/m2  SpO2 98% Physical Exam  Constitutional: He is oriented to person, place, and time. He appears well-developed and well-nourished. No distress.  HENT:  Head: Normocephalic and atraumatic.  Right Ear: External ear normal.  Nose: Nose normal.  Mouth/Throat: Oropharynx is clear and moist. No oropharyngeal exudate.  There is mild diffuse swelling to the patient's left ear. The left ear skin is rough and thick. There is no erythema. There is no drainage noted. There is mild posterior auricular lymphadenopathy noted on the left side. The patient's bilateral tympanic membranes are intact, pearly-gray, without erythema or loss of landmarks.   Eyes: Conjunctivae and EOM are normal. Pupils are equal, round, and reactive to light. Right eye exhibits no discharge. Left eye exhibits no discharge.  Neck: Normal range of motion. Neck supple.  Cardiovascular: Normal rate, regular rhythm, normal heart sounds and intact distal pulses.  Exam reveals no gallop and no friction rub.   No murmur heard. Pulmonary/Chest: Effort normal and breath sounds normal. No respiratory distress. He has no wheezes. He has no rales.  Abdominal: Soft. There is no tenderness.  Musculoskeletal: He exhibits no edema.  Lymphadenopathy:    He has no cervical adenopathy.  Neurological: He is alert and oriented to person, place, and time. No cranial nerve deficit. Coordination normal.  Skin: Skin is warm and dry. No  rash noted. He is not diaphoretic. No erythema. No pallor.  See HENT  Psychiatric: He has a normal mood and affect. His behavior is normal.  Nursing note and vitals reviewed.   ED Course  Procedures (including critical care time) Labs Review Labs Reviewed  CBC WITH DIFFERENTIAL - Abnormal; Notable for the following:    RBC 4.16 (*)    Hemoglobin 12.8 (*)    HCT 38.3 (*)    All other components within normal limits  BASIC METABOLIC PANEL - Abnormal; Notable for the following:    CO2 17 (*)    All other components within normal limits  I-STAT CG4 LACTIC ACID, ED    Imaging Review No results found.   EKG Interpretation None      Filed Vitals:   11/29/14 2200 11/29/14 2215 11/29/14 2230 11/29/14 2245  BP: 145/70 115/56 121/51 117/65  Pulse: 60 69 72 66  Temp:      TempSrc:      Resp:      Height:      Weight:      SpO2: 100% 93% 99% 98%     MDM   Meds given in ED:  Medications  sodium  chloride 0.9 % bolus 1,000 mL (0 mLs Intravenous Stopped 11/29/14 2247)  morphine 4 MG/ML injection 4 mg (4 mg Intravenous Given 11/29/14 2052)  ondansetron (ZOFRAN) injection 4 mg (4 mg Intravenous Given 11/29/14 2104)  ciprofloxacin (CIPRO) tablet 500 mg (500 mg Oral Given 11/29/14 2252)  oxyCODONE-acetaminophen (PERCOCET/ROXICET) 5-325 MG per tablet 2 tablet (2 tablets Oral Given 11/29/14 2252)    Discharge Medication List as of 11/29/2014 10:52 PM    START taking these medications   Details  ciprofloxacin (CIPRO) 500 MG tablet Take 1 tablet (500 mg total) by mouth every 12 (twelve) hours., Starting 11/29/2014, Until Discontinued, Print    !! ciprofloxacin-dexamethasone (CIPRODEX) otic suspension Place 4 drops into the left ear 2 (two) times daily., Starting 11/29/2014, Until Discontinued, Print    !! oxyCODONE-acetaminophen (PERCOCET/ROXICET) 5-325 MG per tablet Take 1-2 tablets by mouth every 4 (four) hours as needed for severe pain. May take 2 tablets PO q 6 hours for severe pain - Do not  take with Tylenol as this tablet already contains tylenol, Starting 11/29/2014, Until Discontinued, Print     !! - Potential duplicate medications found. Please discuss with provider.      Final diagnoses:  Chondritis of auricle, left   This is a 54 year old male who presents the emergency department complaining of left ear pain and drainage for the past 4 days.  The patient was previously admitted on 10/31/2014 for left auricle chondritis and cellulitis. The patient received IV Cipro and Ciprodex. The patient is discharged with Ciprodex and Cipro 500 mg twice a day. The patient completed the course of Cipro on 11/11/2014. The patient was due to follow-up with ENT that he has failed to do so. The patient is afebrile and nontoxic-appearing. The patient's left ear skin is thickened and rough in texture. There is mild diffuse swelling of his left ear. There is no erythema noted. I don't no drainage. The patient's bilateral tympanic membranes are intact and pearly gray without erythema or loss of landmarks. The patient's hearing is intact bilaterally. The patient reports he has an appointment with an ENT tomorrow at 10:30 in the morning. The patient has provided his first dose of Cipro in the ED. Patient provided a prescription for Cipro 500 mg twice a day for 10 days and Ciprodex eardrops. I advised the patient he needs to keep his appointment with ENT doctor tomorrow morning. Strict return precautions were provided. I advised the patient to follow-up with their primary care provider this week. I advised the patient to return to the emergency department with new or worsening symptoms or new concerns. The patient verbalized understanding and agreement with plan.   This patient was discussed with and evaluated by Dr. Effie Shy who agrees with assessment and plan.    Lawana Chambers, PA-C 11/30/14 1610  Flint Melter, MD 11/30/14 [phone]

## 2015-07-01 ENCOUNTER — Emergency Department (HOSPITAL_COMMUNITY)
Admission: EM | Admit: 2015-07-01 | Discharge: 2015-07-01 | Disposition: A | Discharge status: Home / Self Care | Payer: Self-pay | Attending: Emergency Medicine | Admitting: Emergency Medicine

## 2015-07-01 ENCOUNTER — Encounter (HOSPITAL_COMMUNITY): Payer: Self-pay | Admitting: Emergency Medicine

## 2015-07-01 DIAGNOSIS — Z720 Tobacco use: Secondary | ICD-10-CM | POA: Insufficient documentation

## 2015-07-01 DIAGNOSIS — I10 Essential (primary) hypertension: Secondary | ICD-10-CM | POA: Insufficient documentation

## 2015-07-01 DIAGNOSIS — Z872 Personal history of diseases of the skin and subcutaneous tissue: Secondary | ICD-10-CM | POA: Insufficient documentation

## 2015-07-01 DIAGNOSIS — F1410 Cocaine abuse, uncomplicated: Secondary | ICD-10-CM | POA: Insufficient documentation

## 2015-07-01 DIAGNOSIS — F1010 Alcohol abuse, uncomplicated: Secondary | ICD-10-CM | POA: Insufficient documentation

## 2015-07-01 DIAGNOSIS — F419 Anxiety disorder, unspecified: Secondary | ICD-10-CM | POA: Insufficient documentation

## 2015-07-01 DIAGNOSIS — Z79899 Other long term (current) drug therapy: Secondary | ICD-10-CM | POA: Insufficient documentation

## 2015-07-01 DIAGNOSIS — Z8639 Personal history of other endocrine, nutritional and metabolic disease: Secondary | ICD-10-CM | POA: Insufficient documentation

## 2015-07-01 LAB — RAPID URINE DRUG SCREEN, HOSP PERFORMED
AMPHETAMINES: NOT DETECTED
BENZODIAZEPINES: NOT DETECTED
Barbiturates: NOT DETECTED
COCAINE: POSITIVE — AB
OPIATES: NOT DETECTED
Tetrahydrocannabinol: NOT DETECTED

## 2015-07-01 LAB — I-STAT CHEM 8, ED
BUN: 8 mg/dL (ref 6–20)
CALCIUM ION: 1.18 mmol/L (ref 1.12–1.23)
CREATININE: 1.1 mg/dL (ref 0.61–1.24)
Chloride: 106 mmol/L (ref 101–111)
Glucose, Bld: 90 mg/dL (ref 65–99)
HEMATOCRIT: 43 % (ref 39.0–52.0)
Hemoglobin: 14.6 g/dL (ref 13.0–17.0)
Potassium: 4 mmol/L (ref 3.5–5.1)
Sodium: 140 mmol/L (ref 135–145)
TCO2: 24 mmol/L (ref 0–100)

## 2015-07-01 LAB — ETHANOL: Alcohol, Ethyl (B): 7 mg/dL — ABNORMAL HIGH (ref <5)

## 2015-07-01 MED ORDER — LORAZEPAM 2 MG/ML IJ SOLN: 0.0000 mg | Freq: Two times a day (BID) | INTRAMUSCULAR | Status: DC

## 2015-07-01 MED ORDER — LORAZEPAM 0.5 MG PO TABS: 0.0000 mg | ORAL_TABLET | Freq: Four times a day (QID) | ORAL | Status: DC

## 2015-07-01 MED ORDER — LORAZEPAM 0.5 MG PO TABS: 0.0000 mg | ORAL_TABLET | Freq: Two times a day (BID) | ORAL | Status: DC

## 2015-07-01 MED ORDER — CARBAMAZEPINE 200 MG PO TABS: ORAL_TABLET | ORAL | Status: AC

## 2015-07-01 MED ORDER — THIAMINE HCL 100 MG/ML IJ SOLN: 100.0000 mg | Freq: Every day | INTRAMUSCULAR | Status: DC

## 2015-07-01 MED ORDER — CARBAMAZEPINE 200 MG PO TABS: ORAL_TABLET | ORAL | Status: DC

## 2015-07-01 MED ORDER — CARBAMAZEPINE 200 MG PO TABS
800.0000 mg | ORAL_TABLET | Freq: Once | ORAL | Status: AC
  Administered 2015-07-01: 800 mg via ORAL
  Filled 2015-07-01: qty 4

## 2015-07-01 MED ORDER — VITAMIN B-1 100 MG PO TABS: 100.0000 mg | ORAL_TABLET | Freq: Every day | ORAL | Status: DC

## 2015-07-01 MED ORDER — LORAZEPAM 2 MG/ML IJ SOLN: 0.0000 mg | Freq: Four times a day (QID) | INTRAMUSCULAR | Status: DC

## 2015-07-01 NOTE — BH Assessment (Signed)
Reviewed ED notes prior to initiating assessment. Per ED notes pt presents for help with cocaine abuse and etoh abuse. Denies SI/HI and hx of seizures.   Requested cart be placed with pt for assessment. Per Midge Aver in ED they need about 15 minutes to place cart with pt.   Assessment to commence at 2000.    Clista Bernhardt, [facility] Triage Specialist 07/01/2015 7:48 PM

## 2015-07-01 NOTE — BH Assessment (Addendum)
Tele Assessment Note   Spencer Doyle is an 54 y.o. male. Presenting to ED requesting assistance with alcohol and drug abuse. "I don't want this to lead to something worse." Pt reports he has been under a lot of stress, his truck broke down and he was unable to continue traveling to [employer] for work so he has been looking for a job in [place]. He reports frequent conflict with girl friend, with whom he moved in with about three months ago, and legal issues related to child support. At the time of assessment pt is alert and oriented times 4 with depressed and anxious mood, appropriate affect and slow but coherent speech. Pt denies HI, SI, and hx of self harm. He reports he has been abusing etoh and cocaine since his teens, but especially since moving in with his girl friend "to try to put her out of my mind." Pt reports onset of mild visual hallucinations in the past month or so, seeing shadows and movement out of the corner of his eye, when not using etoh or drugs.   Pt reports he has been drinking up to a case of beer per day for the past three months. He reports hx of drinking since age 52, and having slowed down, until moving in with SO. He reports this is worst his drinking has ever been. He has only had two days sober in the past and reports hx of DTs, but no hx of seizure. Pt has been using a gram of cocaine 4/wk for the past month, with hx of lesser use since age [age]. Pt would like to detox and get resources to help him get sober long term, he wants to get a job and be a better dad.   Pt reports sx of depression for past three months, trouble staying asleep, ruminations, loss of pleasure, loss of motivation, irritability, feeling worthless. Pt denies SI, denies sx of mania. Pt reports anxiety with onset 1-2 years ago, with frequent worry, self doubt and racing thoughts. Denies OCD, phobias, or panic attacks.   Family history is positive for etoh abuse (father) negative for MH and SI concerns.    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 Hypertension   . High cholesterol   . Tobacco abuse   . Pancreatitis     HX OF   . Cellulitis 10/31/2014    LEFT EAR    History reviewed. No pertinent past surgical history.  Family History: History reviewed. No pertinent family history.  Social History:  reports that he has been smoking Cigarettes.  He has a 13.5 pack-year smoking history. He has never used smokeless tobacco. He reports that he drinks alcohol. He reports that he uses illicit drugs (Cocaine).  Additional Social History:  Alcohol / Drug Use Pain Medications: see PTA Prescriptions: see PTA Over the Counter: See PTA  History of alcohol / drug use?: Yes Longest period of sobriety (when/how long): 2 days, no hx of seizures Negative Consequences of Use: Personal relationships, Legal, Financial, Work / School Withdrawal Symptoms:  (shakes, reports feels like whole body is twitching ) Substance #1 Name of Substance 1: etoh 1 - Age of First Use: [age] 1 - Amount (size/oz): a case of beer 1 - Frequency: daily  1 - Duration: 3 months at this level, but on  and off for years  1 - Last Use / Amount: 8-8-116 about 8 ounces, BAL 7 at ED arrival  Substance #2 Name of Substance 2: cocaine  2 - Age of First Use: [age] 2 - Amount (size/oz): up to a gram  2 - Frequency: daily  2 - Duration: a month at this level  2 - Last Use / Amount: 07-01-15  CIWA: CIWA-Ar BP: 137/96 mmHg Pulse Rate: 112 Nausea and Vomiting: no nausea and no vomiting Tactile Disturbances: none Tremor: not visible, but can be felt fingertip to fingertip Auditory Disturbances: not present Paroxysmal Sweats: no sweat visible Visual Disturbances: not present Anxiety: no anxiety, at ease Headache, Fullness in Head: none  present Agitation: normal activity Orientation and Clouding of Sensorium: oriented and can do serial additions CIWA-Ar Total: 1 COWS:    PATIENT STRENGTHS: (choose at least two) Ability for insight Average or above average intelligence Communication skills Work skills  Allergies: No Known Allergies  Home Medications:  (Not in a hospital admission)  OB/GYN Status:  No LMP for male patient.  General Assessment Data Location of Assessment: [facility] ED TTS Assessment: In system Is this a Tele or Face-to-Face Assessment?: Tele Assessment Is this an Initial Assessment or a Re-assessment for this encounter?: Initial Assessment Marital status: Long term relationship Is patient pregnant?: No Pregnancy Status: No Living Arrangements: Spouse/significant other Can pt return to current living arrangement?: Yes Admission Status: Voluntary Is patient capable of signing voluntary admission?: Yes Referral Source: Self/Family/Friend Insurance type: none     Crisis Care Plan Living Arrangements: Spouse/significant other Name of Psychiatrist: none Name of Therapist: none  Education Status Is patient currently in school?: No Current Grade: NA Highest grade of school patient has completed: some college Name of school: NA Contact person: NA  Risk to self with the past 6 months Suicidal Ideation: No Has patient been a risk to self within the past 6 months prior to admission? : No Suicidal Intent: No Has patient had any suicidal intent within the past 6 months prior to admission? : No Is patient at risk for suicide?: No Suicidal Plan?: No Has patient had any suicidal plan within the past 6 months prior to admission? : No Access to Means: No What has been your use of drugs/alcohol within the last 12 months?: Pt abuses alcohol and cocaine  Previous Attempts/Gestures: No How many times?: 0 Other Self Harm Risks: none Triggers for Past Attempts: None known Intentional Self Injurious  Behavior: None Family Suicide History: No Recent stressful life event(s): Conflict (Comment), Job Loss, Other (Comment) (truck broke down unable to go to work in [employer]) Persecutory voices/beliefs?: No Depression: Yes Depression Symptoms: Despondent, Insomnia, Tearfulness, Isolating, Guilt, Loss of interest in usual pleasures, Feeling worthless/self pity, Feeling angry/irritable Substance abuse history and/or treatment for substance abuse?: Yes Suicide prevention information given to non-admitted patients: Yes  Risk to Others within the past 6 months Homicidal Ideation: No Does patient have any lifetime risk of violence toward others beyond the six months prior to admission? : No Thoughts of Harm to Others: No Current Homicidal Intent: No Current Homicidal Plan: No Access to Homicidal Means: No Identified Victim: none History of harm to others?: No Assessment of Violence: None Noted Violent Behavior Description: none Does patient have access to weapons?: Yes (Comment) (guns and knife) Criminal Charges Pending?: No Does patient have a court date: Yes Court Date:  (not assigned yet for child support) Is patient on probation?: No  Psychosis Hallucinations: Visual (shapes  and movement out of corner of the his eye) Delusions: None noted  Mental Status Report Appearance/Hygiene: Unremarkable Eye Contact: Good Motor Activity: Unremarkable Speech: Slow, Logical/coherent Level of Consciousness: Alert Mood: Depressed, Anxious Affect: Appropriate to circumstance Anxiety Level: Moderate Thought Processes: Coherent, Relevant Judgement: Unimpaired Orientation: Person, Place, Time, Situation Obsessive Compulsive Thoughts/Behaviors: None  Cognitive Functioning Concentration: Normal Memory: Recent Intact, Remote Intact IQ: Average Insight: Good Impulse Control: Poor Appetite: Poor Weight Loss: 8 Weight Gain: 0 Sleep: Decreased Total Hours of Sleep: 5 Vegetative Symptoms:  None  ADLScreening [facility] Assessment Services) Patient's cognitive ability adequate to safely complete daily activities?: Yes Patient able to express need for assistance with ADLs?: Yes Independently performs ADLs?: Yes (appropriate for developmental age)  Prior Inpatient Therapy Prior Inpatient Therapy: No Prior Therapy Dates: NA Prior Therapy Facilty/Provider(s): NA Reason for Treatment: NA  Prior Outpatient Therapy Prior Outpatient Therapy: No Prior Therapy Dates: NA Prior Therapy Facilty/Provider(s): NA Reason for Treatment: NA Does patient have an ACCT team?: No Does patient have Intensive In-House Services?  : No Does patient have [facility] services? : No Does patient have P4CC services?: No  ADL Screening (condition at time of admission) Patient's cognitive ability adequate to safely complete daily activities?: Yes Is the patient deaf or have difficulty hearing?: No Does the patient have difficulty seeing, even when wearing glasses/contacts?: No Does the patient have difficulty concentrating, remembering, or making decisions?: No Patient able to express need for assistance with ADLs?: Yes Does the patient have difficulty dressing or bathing?: No Independently performs ADLs?: Yes (appropriate for developmental age) Does the patient have difficulty walking or climbing stairs?: No Weakness of Legs: None Weakness of Arms/Hands: None  Home Assistive Devices/Equipment Home Assistive Devices/Equipment: None    Abuse/Neglect Assessment (Assessment to be complete while patient is alone) Physical Abuse: Denies Verbal Abuse: Denies Sexual Abuse: Denies Exploitation of patient/patient's resources: Denies Self-Neglect: Denies Values / Beliefs Cultural Requests During Hospitalization: None Spiritual Requests During Hospitalization: None [employer] fast streamer )   Merchant [employer] officer (For Healthcare) Does patient have an advance directive?: No Would patient like information on creating an  advanced directive?: No - patient declined information Nutrition Screen- [facility] Adult/[facility]/AP Patient's home diet: Regular Has the patient recently lost weight without trying?: Yes, 2-13 lbs. Has the patient been eating poorly because of a decreased appetite?: No Malnutrition Screening Tool Score: 1  Additional Information 1:1 In Past 12 Months?: No CIRT Risk: No Elopement Risk: No Does patient have medical clearance?: Yes     Disposition:  Per Donell Sievert, PA pt does not meet inpt criteria and should be discharged with OP resources. Informed Burgess Amor PA- C of recommendations.   Informed Lajean Silvius' RN of recommendations. Faxed over resources.       Clista Bernhardt, [facility] Triage Specialist 07/01/2015 8:32 PM  Disposition Initial Assessment Completed for this Encounter: Yes  Ellenore Roscoe M 07/01/2015 8:30 PM

## 2015-07-01 NOTE — ED Provider Notes (Signed)
CSN: [id number]     Arrival date & time 07/01/15  1305 History  This chart was scribed for non-physician practitioner Burgess Amor, PA-C working with Lyndal Pulley, MD by Littie Deeds, ED Scribe. This patient was seen in room TR05C/TR05C and the patient's care was started at 5:30 PM.     Chief Complaint  Patient presents with  . Addiction Problem  . Medical Clearance   The history is provided by the patient. No language interpreter was used.    HPI Comments: Spencer Doyle is a 54 y.o. male who presents to the Emergency Department complaining of for detox from EtOH and cocaine. Patient decided to come in today on his own accord because he is tired of his habit. He drinks alcohol every day since 3 months ago, about a case of beer a day. His last use of cocaine was this morning. Today he has had about 80 oz of beer. He does not remember when the last time he tried to cut back, but he has had chills, tremors and hallucinations as withdrawal symptoms but denies withdrawal seizures. Patient did quit 12 years ago on his own, but has since started drinking alcohol and using cocaine again. He has not eaten in the past 2 days. Patient denies seizures and SI. He does admit to smoking cigarettes.   Past Medical History  Diagnosis Date  . Hypertension   . High cholesterol   . Tobacco abuse   . Pancreatitis     HX OF   . Cellulitis 10/31/2014    LEFT EAR   History reviewed. No pertinent past surgical history. History reviewed. No pertinent family history. History  Substance Use Topics  . Smoking status: Current Every Day Smoker -- 0.50 packs/day for 27 years    Types: Cigarettes  . Smokeless tobacco: Never Used  . Alcohol Use: Yes    Review of Systems  Constitutional: Negative for fever.  HENT: Negative for congestion and sore throat.   Eyes: Negative.   Respiratory: Negative for chest tightness and shortness of breath.   Cardiovascular: Negative for chest pain.  Gastrointestinal: Negative for  nausea and abdominal pain.  Genitourinary: Negative.   Musculoskeletal: Negative for joint swelling, arthralgias and neck pain.  Skin: Negative.  Negative for rash and wound.  Neurological: Negative for dizziness, seizures, weakness, light-headedness, numbness and headaches.  Psychiatric/Behavioral: Negative.  Negative for suicidal ideas.      Allergies  Review of patient's allergies indicates no known allergies.  Home Medications   Prior to Admission medications   Medication Sig Start Date End Date Taking? Authorizing Provider  gemfibrozil (LOPID) 600 MG tablet Take 600 mg by mouth 2 (two) times daily before a meal.   Yes Historical Provider, MD  hydrochlorothiazide (HYDRODIURIL) 25 MG tablet Take 25 mg by mouth daily.    Yes Historical Provider, MD  Omega-3 Fatty Acids (FISH OIL PO) Take 1 capsule by mouth daily.   Yes Historical Provider, MD  vitamin C (ASCORBIC ACID) 500 MG tablet Take 500 mg by mouth daily.   Yes Historical Provider, MD  VITAMIN D, CHOLECALCIFEROL, PO Take 1 capsule by mouth daily.   Yes Historical Provider, MD  carbamazepine (TEGRETOL) 200 MG tablet  PO QD X 1D, then  QD X 1D, then  PO QD X 2D 07/01/15   Burgess Amor, PA-C   BP 150/95 mmHg  Pulse 93  Temp(Src) 98.7 F (37.1 C) (Oral)  Resp 16  SpO2 100% Physical Exam  Constitutional: He appears well-developed and  well-nourished.  HENT:  Head: Normocephalic and atraumatic.  Eyes: Conjunctivae are normal.  Neck: Normal range of motion.  Cardiovascular: Normal rate, regular rhythm, normal heart sounds and intact distal pulses.   Pulmonary/Chest: Effort normal and breath sounds normal. He has no wheezes.  Abdominal: Soft. Bowel sounds are normal. There is no tenderness.  Musculoskeletal: Normal range of motion.  Neurological: He is alert.  Skin: Skin is warm and dry.  Psychiatric: His mood appears anxious.  Nursing note and vitals reviewed.   ED Course  Procedures  DIAGNOSTIC  STUDIES: Oxygen Saturation is 100% on room air, normal by my interpretation.    COORDINATION OF CARE: 5:39 PM-Discussed treatment plan which includes labs with patient/guardian at bedside and patient/guardian agreed to plan.    Labs Review Labs Reviewed  ETHANOL - Abnormal; Notable for the following:    Alcohol, Ethyl (B) 7 (*)    All other components within normal limits  URINE RAPID DRUG SCREEN, HOSP PERFORMED - Abnormal; Notable for the following:    Cocaine POSITIVE (*)    All other components within normal limits  I-STAT CHEM 8, ED    Imaging Review No results found.   EKG Interpretation None      MDM   Final diagnoses:  ETOH abuse  Cocaine abuse    Pt was evaluated by TTS.  Does not meet inpatient criteria for placement.  Pt was placed on tegretol to help with withdrawal sx, first dose given here.  He was given referral numbers for obtaining substance abuse assistance.  Pt understands he can arrange outpatient assistance on his own.  I personally performed the services described in this documentation, which was scribed in my presence. The recorded information has been reviewed and is accurate.   Burgess Amor, PA-C 07/02/15 1505  Lyndal Pulley, MD 07/02/15 [phone]

## 2015-07-01 NOTE — ED Notes (Signed)
TTS monitor at bedside 

## 2015-07-01 NOTE — ED Notes (Signed)
Pt sts here for detox from ETOH and cocaine; pt sts last use was several hours ago; pt sts drinks a case of beer a day; pt denies SI/HI

## 2015-07-01 NOTE — Discharge Instructions (Signed)
Alcohol Withdrawal °Anytime drug use is interfering with normal living activities it has become abuse. This includes problems with family and friends. Psychological dependence has developed when your mind tells you that the drug is needed. This is usually followed by physical dependence when a continuing increase of drugs are required to get the same feeling or "high." This is known as addiction or chemical dependency. A person's risk is much higher if there is a history of chemical dependency in the family. °Mild Withdrawal Following Stopping Alcohol, When Addiction or Chemical Dependency Has Developed °When a person has developed tolerance to alcohol, any sudden stopping of alcohol can cause uncomfortable physical symptoms. Most of the time these are mild and consist of tremors in the hands and increases in heart rate, breathing, and temperature. Sometimes these symptoms are associated with anxiety, panic attacks, and bad dreams. There may also be stomach upset. Normal sleep patterns are often interrupted with periods of inability to sleep (insomnia). This may last for 6 months. Because of this discomfort, many people choose to continue drinking to get rid of this discomfort and to try to feel normal. °Severe Withdrawal with Decreased or No Alcohol Intake, When Addiction or Chemical Dependency Has Developed °About five percent of alcoholics will develop signs of severe withdrawal when they stop using alcohol. One sign of this is development of generalized seizures (convulsions). Other signs of this are severe agitation and confusion. This may be associated with believing in things which are not real or seeing things which are not really there (delusions and hallucinations). Vitamin deficiencies are usually present if alcohol intake has been long-term. Treatment for this most often requires hospitalization and close observation. °Addiction can only be helped by stopping use of all chemicals. This is hard but may  save your life. With continual alcohol use, possible outcomes are usually loss of self respect and esteem, violence, and death. °Addiction cannot be cured but it can be stopped. This often requires outside help and the care of professionals. Treatment centers are listed in the yellow pages under Cocaine, Narcotics, and Alcoholics Anonymous. Most hospitals and clinics can refer you to a specialized care center. °It is not necessary for you to go through the uncomfortable symptoms of withdrawal. Your caregiver can provide you with medicines that will help you through this difficult period. Try to avoid situations, friends, or drugs that made it possible for you to keep using alcohol in the past. Learn how to say no. °It takes a long period of time to overcome addictions to all drugs, including alcohol. There may be many times when you feel as though you want a drink. After getting rid of the physical addiction and withdrawal, you will have a lessening of the craving which tells you that you need alcohol to feel normal. Call your caregiver if more support is needed. Learn who to talk to in your family and among your friends so that during these periods you can receive outside help. Alcoholics Anonymous (AA) has helped many people over the years. To get further help, contact AA or call your caregiver, counselor, or clergyperson. Al-Anon and Alateen are support groups for friends and family members of an alcoholic. The people who love and care for an alcoholic often need help, too. For information about these organizations, check your phone directory or call a local alcoholism treatment center.  °SEEK IMMEDIATE MEDICAL CARE IF:  °· You have a seizure. °· You have a fever. °· You experience uncontrolled vomiting or you   vomit up blood. This may be bright red or look like black coffee grounds. °· You have blood in the stool. This may be bright red or appear as a black, tarry, bad-smelling stool. °· You become lightheaded or  faint. Do not drive if you feel this way. Have someone else drive you or call 911 for help. °· You become more agitated or confused. °· You develop uncontrolled anxiety. °· You begin to see things that are not really there (hallucinate). °Your caregiver has determined that you completely understand your medical condition, and that your mental state is back to normal. You understand that you have been treated for alcohol withdrawal, have agreed not to drink any alcohol for a minimum of 1 day, will not operate a car or other machinery for 24 hours, and have had an opportunity to ask any questions about your condition. °Document Released: 08/19/2005 Document Revised: 02/01/2012 Document Reviewed: 06/27/2008 °ExitCare® Patient Information ©2015 [employer]. This information is not intended to replace advice given to you by your health care provider. Make sure you discuss any questions you have with your health care provider. ° ° ° °Emergency Department Resource Guide °1) Find a Doctor and Pay Out of Pocket °Although you won't have to find out who is covered by your insurance plan, it is a good idea to ask around and get recommendations. You will then need to call the office and see if the doctor you have chosen will accept you as a new patient and what types of options they offer for patients who are self-pay. Some doctors offer discounts or will set up payment plans for their patients who do not have insurance, but you will need to ask so you aren't surprised when you get to your appointment. ° °2) Contact Your Local Health Department °Not all health departments have doctors that can see patients for sick visits, but many do, so it is worth a call to see if yours does. If you don't know where your local health department is, you can check in your phone book. The CDC also has a tool to help you locate your state's health department, and many state websites also have listings of all of their local health  departments. ° °3) Find a Walk-in Clinic °If your illness is not likely to be very severe or complicated, you may want to try a walk in clinic. These are popping up all over the country in pharmacies, drugstores, and shopping centers. They're usually staffed by nurse practitioners or physician assistants that have been trained to treat common illnesses and complaints. They're usually fairly quick and inexpensive. However, if you have serious medical issues or chronic medical problems, these are probably not your best option. ° °No Primary Care Doctor: °- Call [facility] at  832-8000 - they can help you locate a primary care doctor that  accepts your insurance, provides certain services, etc. °- Physician Referral Service- 1-800-533-3463 ° °Chronic Pain Problems: °Organization         Address  Phone   Notes  °[facility]  (336) 297-2271 Patients need to be referred by their primary care doctor.  ° °Medication Assistance: °Organization         Address  Phone   Notes  °[place] [facility] [address] °[place], [place] [address] (336) 641-8030 --Must be a resident of [place] °-- Must have NO insurance coverage whatsoever (no Medicaid/ Medicare, etc.) °-- The pt. MUST have   a primary care doctor that directs their care regularly and follows them in the community °  °[facility]  (866) 331-1348   °[employer]  (888) 892-1162   ° °Agencies that provide inexpensive medical care: °Organization         Address  Phone   Notes  °[facility]  (336) 832-8035   °[facility]    (336) 832-7272   °[facility] [address] °[place], [place] [address] (336) 832-4777   °[facility] [address], °[place] (336) 271-4999   °[facility]    (336) 373-0678   °[facility]    (336) 272-1050   °[facility] ° [address], [place] Phone:  (336)  832-4444, Fax:  (336) 832-4440 Hours of Operation:  9 am - 6 pm, M-F.  Also accepts Medicaid/Medicare and self-pay.  °[facility] ° [address], [place] Phone: (336) [phone], Fax: (336) [phone]. Hours of Operation:  8:30 am - 5:30 pm, M-F.  Also accepts Medicaid and self-pay.  °[facility] [place] [address], [place] Phone: (336) 878-6027   °[facility] [address], [place], [place] (336)723-1848, Ext. 123 Mondays & Thursdays: 7-9 AM.  First 15 patients are seen on a first come, first serve basis. °  ° °Medicaid-accepting [place] Providers: ° °Organization         Address  Phone   Notes  °[facility] [address], [place] (336) 641-2100 Also accepts self-pay patients.  °[facility] [address], [place] ° (336) 856-9996   °[facility] [address], [place] (336) 288-8857   °[facility] [address] [place] [address], [place] (336) 299-7000   °Veita Bland [address], [place]  ° (336) 373-1557 Only accepts [place] [facility] [place] [facility] Medicaid patients after they have their name applied to their card.  ° °Self-Pay (no insurance) in [place]: ° °Organization         Address  Phone   Notes  °Sickle Cell Patients, [facility] [address], [place] (336) 832-1970   °[facility] [address], [place] (336) 832-4400   °[facility] ° [address], [place] (336) 992-4800   °[facility]/Dr. Osei-Bonsu ° [address] [place] [address], [place] or [address], [place] (336) [phone] Phone number for both [place] and [place] locations is the same.  °[facility] [address], [place] (336) 299-0000   °[facility] [address] [place] [address], [place] or [address] (336)  852-7530 °(336) 878-2260   °[facility] [address], [place] (336) 350-1642, phone; (336) 294-5005, fax Sees patients 1st and 3rd Saturday of every month.  Must not qualify for public or private insurance (i.e. Medicaid, Medicare, [place] Health Choice, Veterans' Benefits) • Household income should be no more than 200% of the poverty level •The clinic cannot treat you if you are pregnant or think you are pregnant • Sexually transmitted diseases are not treated at the clinic.  ° ° °Dental Care: °Organization         Address  Phone  Notes  °[facility] [facility] [address],  (  336) 641-6152 Accepts children up to age 21 who are enrolled in Medicaid or [place] Health Choice; pregnant women with a Medicaid card; and children who have applied for Medicaid or [place] Health Choice, but were declined, whose parents can pay a reduced fee at time of service.  °[facility] [place]  [address], [place] (336) 641-7733 Accepts children up to age 21 who are enrolled in Medicaid or [place] Health Choice; pregnant women with a Medicaid card; and children who have applied for Medicaid or [place] Health Choice, but were declined, whose parents can pay a reduced fee at time of service.  °[facility] ° [address], [place] (336) 641-4533 Patients are seen by appointment only. Walk-ins are not accepted. [facility] will see patients 18 years of age and older. °Monday - Tuesday (8am-5pm) °Most Wednesdays (8:30-5pm) °$30 per visit, cash only  °[facility] ° [address], [place] (336) [phone] Patients are seen by appointment only. Walk-ins are not accepted. [facility] will see patients 18 years of age and older. °One Wednesday Evening (Monthly: Volunteer Based).  $30 per visit, cash only  °[facility]  (919) 537-3737 for adults;  Children under age 4, call Graduate Pediatric Dentistry at ([phone]) [phone]. Children aged 4-14, please call ([phone]) [phone] to request a pediatric application. ° Dental services are provided in all areas of dental care including fillings, crowns and bridges, complete and partial dentures, implants, gum treatment, root canals, and extractions. Preventive care is also provided. Treatment is provided to both adults and children. °Patients are selected via a lottery and there is often a waiting list. °  °[facility] [address], °[place] ° (336) 763-8833 [email or web address] °  °[facility] [address], [place], [place] (336)723-1848, Ext. 123 Second and Fourth Thursday of each month, opens at 6:30 AM; Clinic ends at 9 AM.  Patients are seen on a first-come first-served basis, and a limited number are seen during each clinic.  ° °[facility] ° [address], [place], [place] (336) 723-7904   Eligibility Requirements °You must have lived in [place], [place], or [place] counties for at least the last three months. °  You cannot be eligible for state or federal sponsored healthcare insurance, including Veterans Administration, Medicaid, or Medicare. °  You generally cannot be eligible for healthcare insurance through your employer.  °  How to apply: °Eligibility screenings are held every Tuesday and Wednesday afternoon from 1:00 pm until 4:00 pm. You do not need an appointment for the interview!  °[facility] [address], [place], [place] 336-631-2330   °[facility]  336-342-8273   °[facility]  336-703-3100   °[facility]  336-570-6415   ° °Behavioral Health Resources in the Community: °Intensive Outpatient Programs °Organization         Address  Phone  Notes  °[facility] [address], [place], [place] 336-878-6098   °[facility] [address], [place], [place] 336-832-9800   °[facility] [address], [place], [place] ° 336-882-2125   °[facility] [address],  °[place], [place] 1-800-853-5163 or 336-641-4981   °Substance Abuse Resources °Organization         Address  Phone  Notes  °[facility]    336-882-2125   °[facility]  336-784-9470   °[facility]  336-285-9073   °[facility]  336-845-3988   °Residential & Outpatient Substance Abuse Program  1-800-659-3381   °Psychological Services °Organization         Address  Phone  Notes  °[facility]  336- 832-9600   °[facility]  336- 378-7881   °[facility] [address], [place] 1-800-853-5163 or 336-641-4981   ° °Mobile Crisis Teams °Organization         Address  Phone  Notes  °[facility], Mobile Crisis Care Unit  1-877-626-1772   °Assertive °[facility] ° [address] [place], [place] 336-834-9664   °Sharon DeEsch [address] °[place] 336-554-5454   ° °Self-Help/Support Groups °Organization         Address  Phone             Notes  °[facility] - variety of support groups  336- 373-1402 Call for more information  °Narcotics Anonymous (NA), [facility] [address], °[place]  2 meetings at this location  ° °Residential Treatment Programs °Organization         Address  Phone  Notes  °[facility] [address],    °[place]  1-866-801-8205   °[facility] ° [address], [place], [place] 704-293-8524   °[facility] [address], [place] 336-845-3988 Admissions: 8am-3pm M-F  °[facility] [address]    °[place], [place] 336-841-1104   °[facility] [address], [place], [place] 336-379-7146   °[facility] [address]  °[place], [place] 336-285-9073   °[facility] - Intensive  Outpatient [address], [place], [place] 336-852-3033   °[facility] [address]  °[place], [place] 1-877-615-2722 or 336-784-9470   °[facility] [address] [place], [place] 336-227-7417 Accepts Medicaid  °[facility] [address]  ° [place] 1-800-659-3381 Substance Abuse/Addiction Treatment  ° °[place] Behavioral Health Resources °Organization         Address  Phone  Notes  °[facility]  (888) 581-9988   °Evangelos Paulino Brannon, PhD [address] [place], [place]   (336) 349-5553 or (336) 951-0000   °[facility]   [address] °[place], [place] (336) 349-4454   °[facility] [address], [place], [place] (336) 342-8316 Insurance/Medicaid/sponsorship through [facility]  °[facility] [address]                                    [place],  (336) 342-8316 Therapy/tele-psych/case  °[facility] [address]  ° [place],  (336) 349-2233    °Dr. Arfeen  (336) 349-4544   °[facility] [place]  [employer] [place] [facility] 1) [address], [place] °2) [address], [place] °3)  [address], [place] (336) 349-3220 °(336) 342-7768 ° °(336) 342-8140   °[place] [facility] (336) 342-1394 or (336) 342-3537 (After Hours)    ° ° ° °

## 2016-07-01 IMAGING — CT CT TEMPORAL BONES W/ CM
4 of 12 series · 17 of 47 positions shown, 19 images · IV contrast (omnipaque)
Comparison: 04/28/2014.

CLINICAL DATA: 53-year-old hypertensive male with history of left
ear pain now with drainage not responding to antibiotics. History of
left year cellulitis and chondritis of the auricle. Initial
encounter.

EXAM:
CT TEMPORAL BONES WITH CONTRAST
TECHNIQUE: Axial and coronal plane CT imaging of the petrous temporal bones was
performed with thin-collimation image reconstruction after
intravenous contrast administration. Multiplanar CT image
reconstructions were also generated. CM/KR
CONTRAST:  100mL OMNIPAQUE IOHEXOL 300 MG/ML  SOLN

[Series 4: temporal bone 0.6 u75u · axial · 0.37mm/px · z∈[-154,-98]mm · 7 of 124 slices shown, 9 images]
[im 16/124  brain]
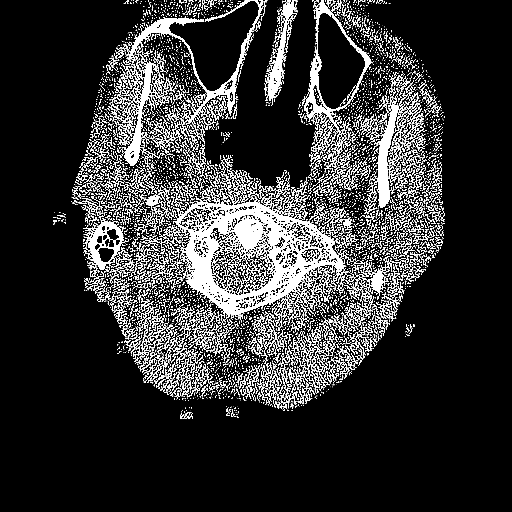
[im 16/124  bone]
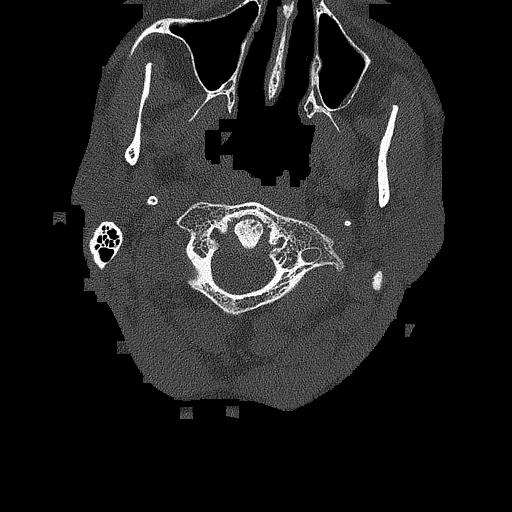
[im 31/124  bone]
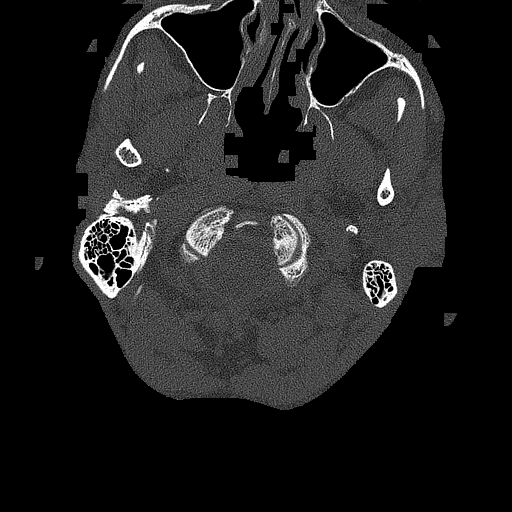
[im 47/124  bone]
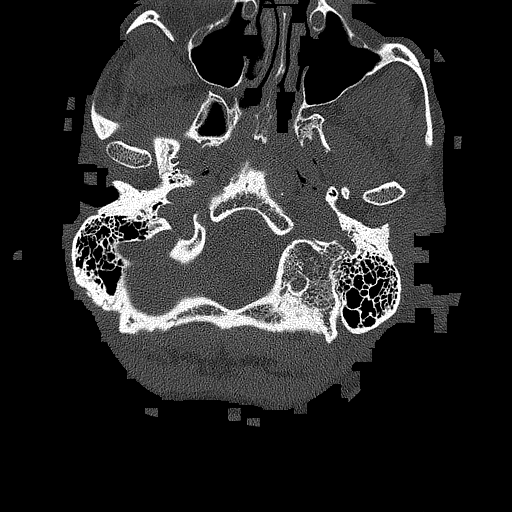
[im 62/124  bone]
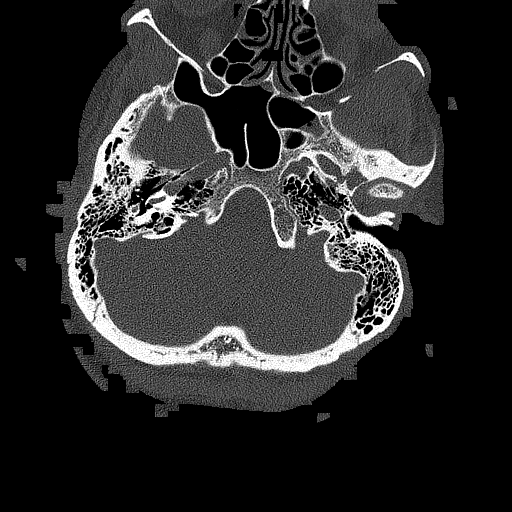
[im 77/124  brain]
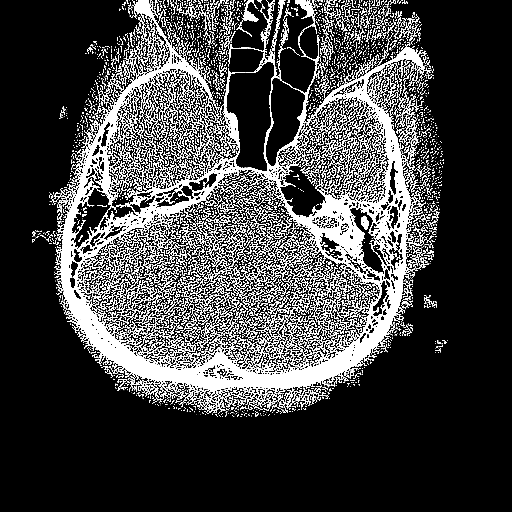
[im 77/124  bone]
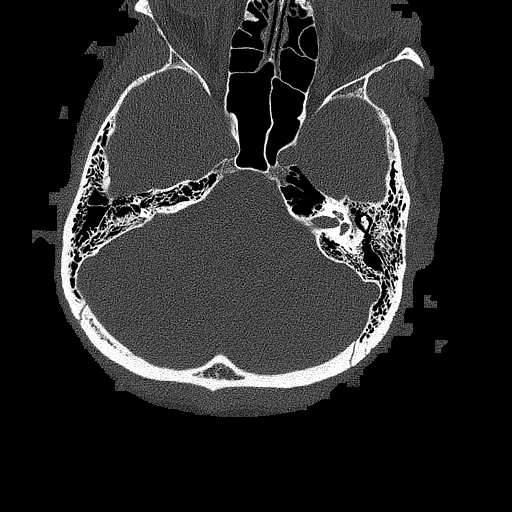
[im 93/124  bone]
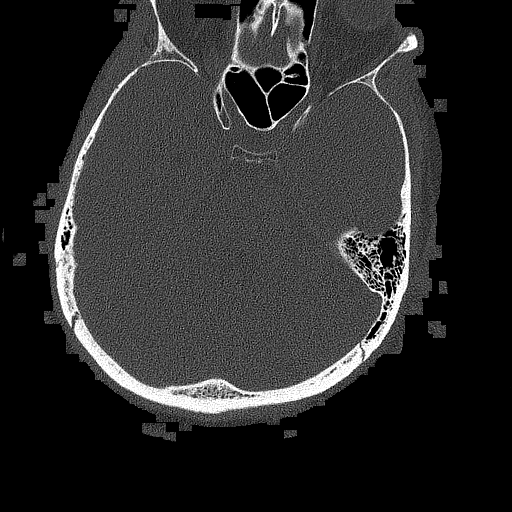
[im 108/124  bone]
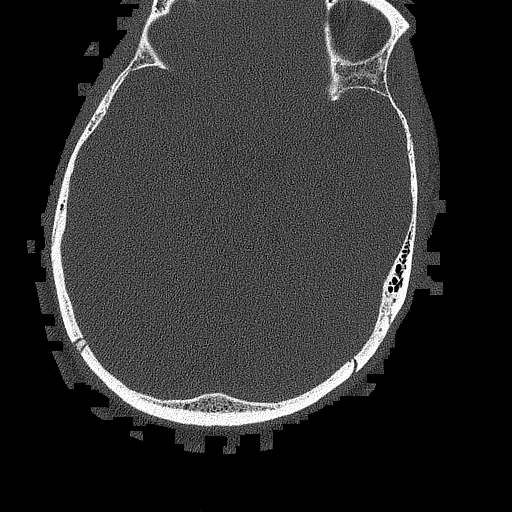

[Series 5: temporal bone left · axial · 0.23mm/px · z∈[-154,-98]mm · 7 of 124 slices shown]
[im 16/124  bone]
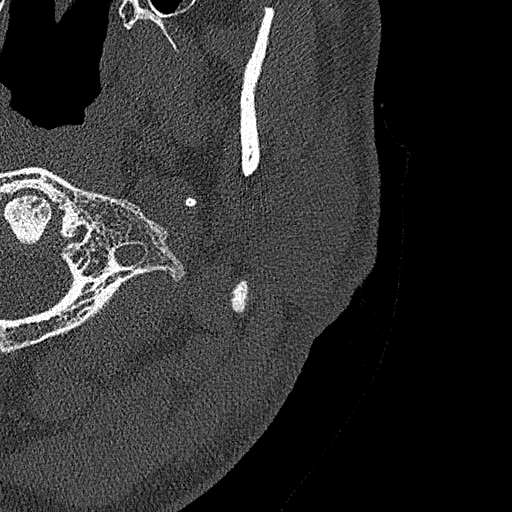
[im 31/124  bone]
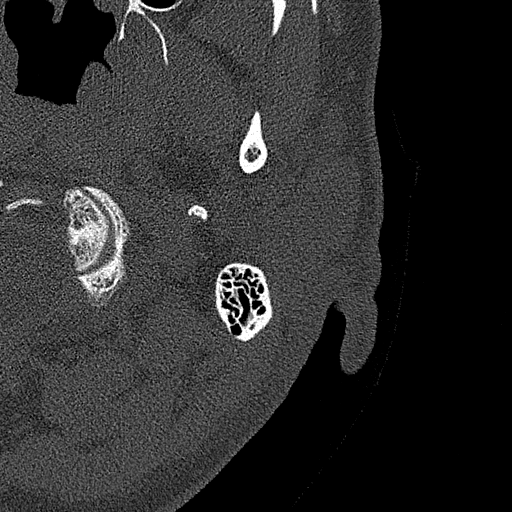
[im 47/124  bone]
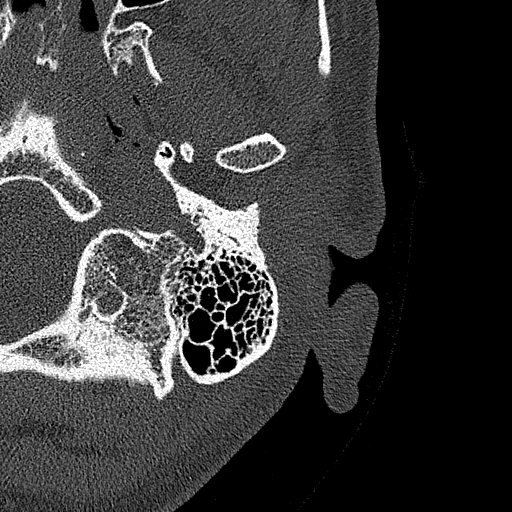
[im 62/124  bone]
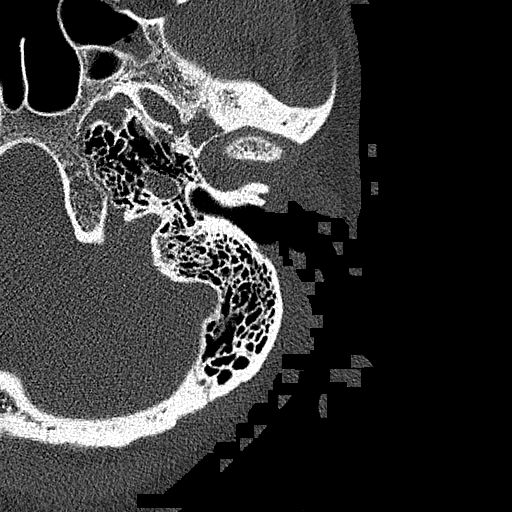
[im 77/124  bone]
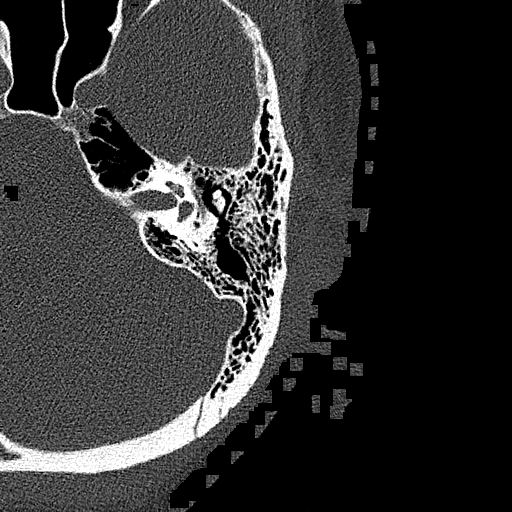
[im 93/124  bone]
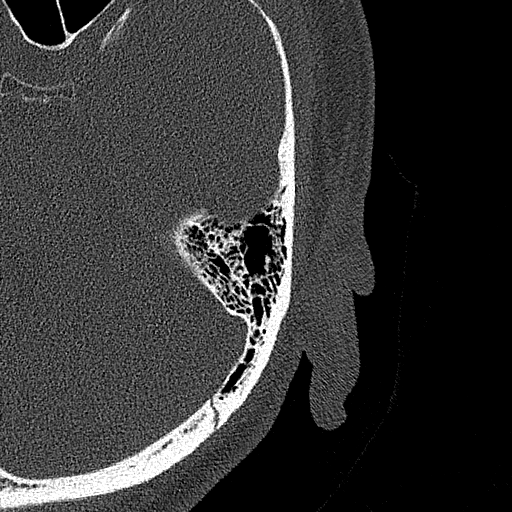
[im 108/124  bone]
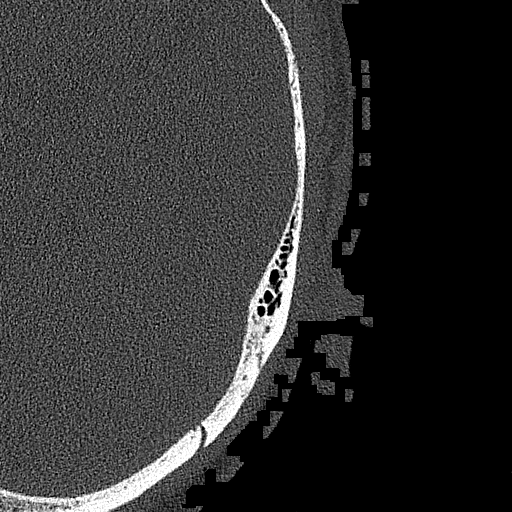

[Series 6: temporal bone right · axial · 0.19mm/px · z∈[-154,-144]mm · 2 of 124 slices shown]
[im 16/124  bone]
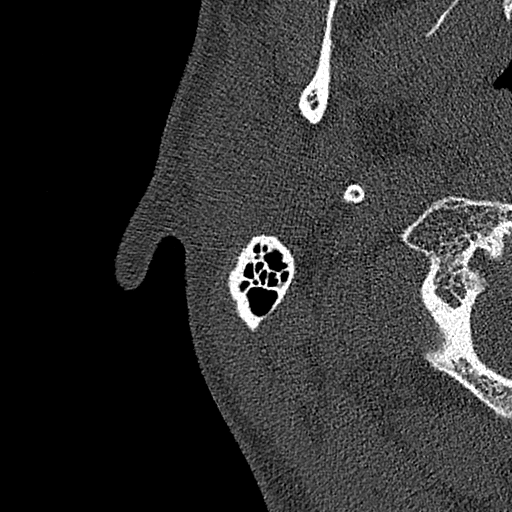
[im 31/124  bone]
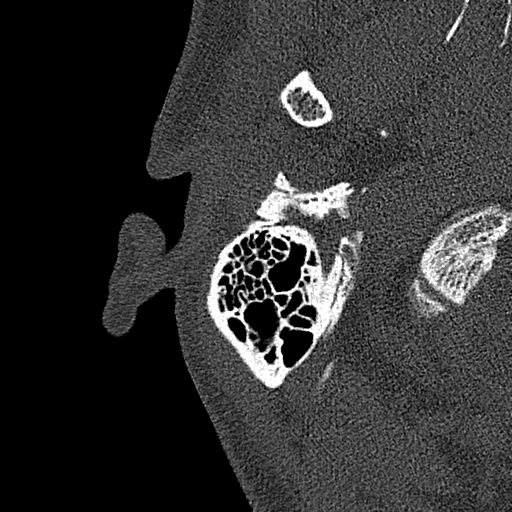

[Series 606: coronal right s.t. · coronal · 0.19mm/px · 1 of 47 slices shown]
[im 24/47  bone]
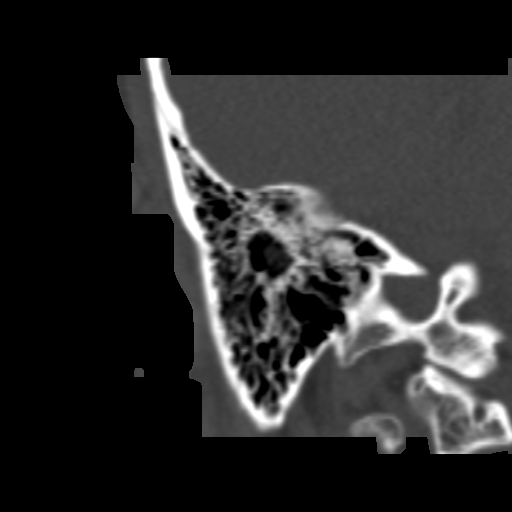

[17 of 47 positions shown; findings below may reference images not displayed]

FINDINGS: Diffuse inflammation of the left ear/auricle. Mild haziness of
adjacent fat planes. Adjacent tiny preauricular lymph nodes.
Inflammation extends to the junction with the external auditory
canal without extension into the external auditory canal.

Minimal partial opacification left mastoid air cells with majority
of the left mastoid air cells clear.

Middle ear cavities are clear bilaterally. Symmetric normal
appearance of the ossicles and labyrinthine structures.

Partial opacification aerated aspect superior left pterygoid
plate/left sphenoid sinus. Very minimal mucosal thickening left
maxillary sinus.

Visualized intracranial structures are unremarkable.
IMPRESSION: Diffuse inflammation of the left ear/auricle. Mild haziness of
adjacent fat planes. Adjacent tiny preauricular lymph nodes.
Inflammation extends to the junction with the external auditory
canal without extension into the external auditory canal.

Minimal partial opacification left mastoid air cells with majority
of the left mastoid air cells clear.

Middle ear cavities are clear bilaterally. Symmetric normal
appearance of the ossicles and labyrinthine structures.

Partial opacification aerated aspect superior left pterygoid
plate/left sphenoid sinus. Very minimal mucosal thickening left
maxillary sinus.
